# Patient Record
Sex: Female | Born: 1980 | Race: White | Hispanic: No | Marital: Married | Smoking: Never smoker
Health system: Southern US, Community
[De-identification: ages and names within clinical notes are randomized; demographics above are authoritative.]

## PROBLEM LIST (undated history)

## (undated) ENCOUNTER — Inpatient Hospital Stay (HOSPITAL_COMMUNITY): Payer: Self-pay

## (undated) DIAGNOSIS — N611 Abscess of the breast and nipple: Secondary | ICD-10-CM

## (undated) HISTORY — PX: BREAST BIOPSY: SHX20

---

## 2014-06-13 NOTE — L&D Delivery Note (Signed)
Delivery Note  Patient presented to the MAU having painful contractions and had cervical change to 4.5. She was admitted and had SROM at 1020. Patient was pushing and making slow progress. She developed a fever to 101 F around 1400, there was fetal tachycardia to the 170s with decelerations. The patient was started on ampicillin and gentamycin for possible chorioamnionitis vs. fever secondary to epidural. Dr. Jolayne Panther was contacted for possible VAD. Patient was given pitocin around 1510 and was to continue pushing for an hour before VAD would be considered. She continued to progress and was able to deliver by SVAD. She pushed for a total of about 3 hours. Fever resolved after delivery, and antibiotics were discontinued.   At 4:00 PM a viable and healthy female was delivered via Vaginal, Spontaneous Delivery (Presentation: Left Occiput Anterior).  APGAR: 7, 9; weight pending.   Placenta status: Intact, Spontaneous.  Cord: 3 vessels with the following complications: None.  Cord pH: N/A  Anesthesia: Epidural  Episiotomy: None Lacerations: 2nd degree Suture Repair: vicryl Est. Blood Loss (mL): 379  Mom to postpartum.  Baby to Couplet care / Skin to Skin.  Jamelle Haring, MD [facility], PGY-1 03/21/2015, 5:25 PM

## 2014-07-28 ENCOUNTER — Inpatient Hospital Stay (HOSPITAL_COMMUNITY): Payer: Medicaid Other

## 2014-07-28 ENCOUNTER — Inpatient Hospital Stay (HOSPITAL_COMMUNITY)
Admission: AD | Admit: 2014-07-28 | Discharge: 2014-07-28 | Disposition: A | Discharge status: Home / Self Care | Payer: Medicaid Other | Source: Ambulatory Visit | Attending: Obstetrics & Gynecology | Admitting: Obstetrics & Gynecology

## 2014-07-28 ENCOUNTER — Encounter (HOSPITAL_COMMUNITY): Payer: Self-pay | Admitting: *Deleted

## 2014-07-28 DIAGNOSIS — N8320 Unspecified ovarian cysts: Secondary | ICD-10-CM | POA: Diagnosis not present

## 2014-07-28 DIAGNOSIS — O9989 Other specified diseases and conditions complicating pregnancy, childbirth and the puerperium: Secondary | ICD-10-CM | POA: Diagnosis not present

## 2014-07-28 DIAGNOSIS — R109 Unspecified abdominal pain: Secondary | ICD-10-CM | POA: Diagnosis not present

## 2014-07-28 DIAGNOSIS — O26899 Other specified pregnancy related conditions, unspecified trimester: Secondary | ICD-10-CM

## 2014-07-28 DIAGNOSIS — O3481 Maternal care for other abnormalities of pelvic organs, first trimester: Secondary | ICD-10-CM | POA: Insufficient documentation

## 2014-07-28 DIAGNOSIS — Z3A01 Less than 8 weeks gestation of pregnancy: Secondary | ICD-10-CM | POA: Diagnosis not present

## 2014-07-28 DIAGNOSIS — N83209 Unspecified ovarian cyst, unspecified side: Secondary | ICD-10-CM

## 2014-07-28 LAB — URINALYSIS, ROUTINE W REFLEX MICROSCOPIC
Bilirubin Urine: NEGATIVE
GLUCOSE, UA: NEGATIVE mg/dL
Ketones, ur: NEGATIVE mg/dL
Nitrite: NEGATIVE
Protein, ur: NEGATIVE mg/dL
Specific Gravity, Urine: 1.02 (ref 1.005–1.030)
UROBILINOGEN UA: 0.2 mg/dL (ref 0.0–1.0)
pH: 5.5 (ref 5.0–8.0)

## 2014-07-28 LAB — HCG, QUANTITATIVE, PREGNANCY: hCG, Beta Chain, Quant, S: 40142 m[IU]/mL — ABNORMAL HIGH (ref <5)

## 2014-07-28 LAB — CBC
HEMATOCRIT: 38 % (ref 36.0–46.0)
Hemoglobin: 12.8 g/dL (ref 12.0–15.0)
MCH: 31.3 pg (ref 26.0–34.0)
MCHC: 33.7 g/dL (ref 30.0–36.0)
MCV: 92.9 fL (ref 78.0–100.0)
PLATELETS: 239 10*3/uL (ref 150–400)
RBC: 4.09 MIL/uL (ref 3.87–5.11)
RDW: 12.5 % (ref 11.5–15.5)
WBC: 8.1 10*3/uL (ref 4.0–10.5)

## 2014-07-28 LAB — ABO/RH: ABO/RH(D): O POS

## 2014-07-28 LAB — POCT PREGNANCY, URINE: PREG TEST UR: POSITIVE — AB

## 2014-07-28 LAB — WET PREP, GENITAL
CLUE CELLS WET PREP: NONE SEEN
TRICH WET PREP: NONE SEEN
YEAST WET PREP: NONE SEEN

## 2014-07-28 LAB — URINE MICROSCOPIC-ADD ON

## 2014-07-28 MED ORDER — ACETAMINOPHEN-CODEINE #3 300-30 MG PO TABS
1.0000 | ORAL_TABLET | Freq: Four times a day (QID) | ORAL | Status: DC | PRN
Start: 2014-07-28 — End: 2014-08-02

## 2014-07-28 NOTE — MAU Provider Note (Signed)
History     CSN: [id number]  Arrival date and time: 07/28/14 1537   First Provider Initiated Contact with Patient 07/28/14 1637      Chief Complaint  Patient presents with  . Abdominal Pain   HPI   Pam Kennedy is a 34 y.o. female G2P0010 who presents with abdominal pain that comes and goes. The pain has been off and on for a couple of days. She denies vaginal bleeding. She does have some yellow vaginal discharge at times.   She denies vaginal bleeding   OB History    Gravida Para Term Preterm AB TAB SAB Ectopic Multiple Living   2    1  1          Past Medical History  Diagnosis Date  . Medical history non-contributory     Past Surgical History  Procedure Laterality Date  . No past surgeries      History reviewed. No pertinent family history.  History  Substance Use Topics  . Smoking status: Never Smoker   . Smokeless tobacco: Never Used  . Alcohol Use: No    Allergies: No Known Allergies  Prescriptions prior to admission  Medication Sig Dispense Refill Last Dose  . vitamin E 400 UNIT capsule Take 400 Units by mouth daily.   07/28/2014 at Unknown time   Results for orders placed or performed during the hospital encounter of 07/28/14 (from the past 48 hour(s))  Urinalysis, Routine w reflex microscopic     Status: Abnormal   Collection Time: 07/28/14  4:00 PM  Result Value Ref Range   Color, Urine YELLOW YELLOW   APPearance HAZY (A) CLEAR   Specific Gravity, Urine 1.020 1.005 - 1.030   pH 5.5 5.0 - 8.0   Glucose, UA NEGATIVE NEGATIVE mg/dL   Hgb urine dipstick LARGE (A) NEGATIVE   Bilirubin Urine NEGATIVE NEGATIVE   Ketones, ur NEGATIVE NEGATIVE mg/dL   Protein, ur NEGATIVE NEGATIVE mg/dL   Urobilinogen, UA 0.2 0.0 - 1.0 mg/dL   Nitrite NEGATIVE NEGATIVE   Leukocytes, UA SMALL (A) NEGATIVE  Urine microscopic-add on     Status: Abnormal   Collection Time: 07/28/14  4:00 PM  Result Value Ref Range   Squamous Epithelial / LPF FEW (A) RARE   WBC, UA  3-6 <3 WBC/hpf   RBC / HPF 0-2 <3 RBC/hpf  Pregnancy, urine POC     Status: Abnormal   Collection Time: 07/28/14  4:09 PM  Result Value Ref Range   Preg Test, Ur POSITIVE (A) NEGATIVE    Comment:        THE SENSITIVITY OF THIS METHODOLOGY IS >24 mIU/mL   Wet prep, genital     Status: Abnormal   Collection Time: 07/28/14  4:58 PM  Result Value Ref Range   Yeast Wet Prep HPF POC NONE SEEN NONE SEEN   Trich, Wet Prep NONE SEEN NONE SEEN   Clue Cells Wet Prep HPF POC NONE SEEN NONE SEEN   WBC, Wet Prep HPF POC FEW (A) NONE SEEN    Comment: MANY BACTERIA SEEN  CBC     Status: None   Collection Time: 07/28/14  5:05 PM  Result Value Ref Range   WBC 8.1 4.0 - 10.5 K/uL   RBC 4.09 3.87 - 5.11 MIL/uL   Hemoglobin 12.8 12.0 - 15.0 g/dL   HCT 77.4 12.8 - 78.6 %   MCV 92.9 78.0 - 100.0 fL   MCH 31.3 26.0 - 34.0 pg   MCHC  33.7 30.0 - 36.0 g/dL   RDW 16.112.5 09.611.5 - 04.515.5 %   Platelets 239 150 - 400 K/uL  ABO/Rh     Status: None (Preliminary result)   Collection Time: 07/28/14  5:05 PM  Result Value Ref Range   ABO/RH(D) O POS   hCG, quantitative, pregnancy     Status: Abnormal   Collection Time: 07/28/14  5:05 PM  Result Value Ref Range   hCG, Beta Chain, Quant, S 40142 (H) <5 mIU/mL    Comment:          GEST. AGE      CONC.  (mIU/mL)   <=1 WEEK        5 - 50     2 WEEKS       50 - 500     3 WEEKS       100 - 10,000     4 WEEKS     1,000 - 30,000     5 WEEKS     3,500 - 115,000   6-8 WEEKS     12,000 - 270,000    12 WEEKS     15,000 - 220,000        FEMALE AND NON-PREGNANT FEMALE:     LESS THAN 5 mIU/mL    [place] Ob Comp Less 14 Wks  07/28/2014   CLINICAL DATA:  Two day history of intermittent right lower quadrant abdominal pain and pelvic pain. Quantitative beta HCG pending. Unknown LMP.  EXAM: OBSTETRIC <14 WK US AND TRANSVAGINAL OB US  TECHNIQUE: Both transabdominal and transvaginal ultrasound examinations were performed for complete evaluation of the gestation as well as the maternal  uterus, adnexal regions, and pelvic cul-de-sac. Transvaginal technique was performed to assess early pregnancy.  COMPARISON:  None.  FINDINGS: Intrauterine gestational sac: Single gestational sac, normal in appearance.  Yolk sac:  Visualized.  Embryo:  Visualized.  Cardiac Activity: Visualized.  Heart Rate: 128  bpm  MSD: 6.1 mm   6 w 3 d  US EDC: 03/20/2015.  Maternal uterus/adnexae: Normal decidual reaction. No subchorionic hemorrhage. Right ovary measures approximately 5.4 x 4.0 x 5.2 cm, containing a simple cyst measuring 3.8 x 3.5 x 3.7 cm. Left ovary measures 2.7 x 1.2 x 2.1 cm containing small follicular cysts. No adnexal masses or free pelvic fluid.  IMPRESSION: 1. Single live intrauterine embryo with estimated gestational age [redacted] weeks 3 days by crown-rump length. [facility] 2. Approximate 4 cm corpus luteum cyst in the right ovary. 3. No adnexal masses or free pelvic fluid.   Electronically Signed   By: Hulan Saashomas  Lawrence M.D.   On: 07/28/2014 18:18   [place] Ob Transvaginal  07/28/2014   CLINICAL DATA:  Two day history of intermittent right lower quadrant abdominal pain and pelvic pain. Quantitative beta HCG pending. Unknown LMP.  EXAM: OBSTETRIC <14 WK US AND TRANSVAGINAL OB US  TECHNIQUE: Both transabdominal and transvaginal ultrasound examinations were performed for complete evaluation of the gestation as well as the maternal uterus, adnexal regions, and pelvic cul-de-sac. Transvaginal technique was performed to assess early pregnancy.  COMPARISON:  None.  FINDINGS: Intrauterine gestational sac: Single gestational sac, normal in appearance.  Yolk sac:  Visualized.  Embryo:  Visualized.  Cardiac Activity: Visualized.  Heart Rate: 128  bpm  MSD: 6.1 mm   6 w 3 d  US EDC: 03/20/2015.  Maternal uterus/adnexae: Normal decidual reaction. No subchorionic hemorrhage. Right ovary measures approximately 5.4 x 4.0 x 5.2 cm, containing a simple  cyst measuring 3.8 x 3.5 x 3.7 cm. Left ovary measures 2.7 x  1.2 x 2.1 cm containing small follicular cysts. No adnexal masses or free pelvic fluid.  IMPRESSION: 1. Single live intrauterine embryo with estimated gestational age [redacted] weeks 3 days by crown-rump length. Ultrasound [facility] 03/20/2015. 2. Approximate 4 cm corpus luteum cyst in the right ovary. 3. No adnexal masses or free pelvic fluid.   Electronically Signed   By: Hulan Saas M.D.   On: 07/28/2014 18:18   [place] Art/ven Flow Abd Pelv Doppler Limited  07/28/2014   CLINICAL DATA:  Pregnant, intermittent RIGHT lower quadrant and RIGHT pelvic pain, intrauterine pregnancy by ultrasound, large RIGHT ovarian cyst question ovarian torsion  EXAM: DOPPLER ULTRASOUND OF OVARIES  TECHNIQUE: Color and duplex Doppler ultrasound was utilized to evaluate blood flow to the ovaries.  COMPARISON:  Earlier OB ultrasound of 07/28/2014  FINDINGS: Pulsed Doppler evaluation of both ovaries was performed.  The RIGHT ovary, which is enlarged by a 3.8 x 4.2 x 3.3 cm diameter simple appearing cyst, demonstrates internal blood flow on color Doppler imaging.  Arterial and venous waveforms are present within the RIGHT ovary.  No evidence of RIGHT ovarian torsion.  Doppler assessment of the LEFT ovary is limited by ovarian size and position.  Blood flow is seen within the LEFT ovary on color Doppler imaging.  However, only a faint venous waveform is obtained and an adequate arterial waveform is not identified, felt to be due to ovarian position and technical limitations.  Patient is nontender at the LEFT adnexa with transvaginal imaging.  IMPRESSION: RIGHT ovary enlarged by a 4.2 cm diameter simple appearing cyst.  No evidence of RIGHT ovarian torsion.  Limited Doppler assessment of the LEFT ovary, demonstrating internal blood flow on color Doppler imaging and a faint venous waveform though a definite arterial waveform was not obtained, felt be technical in origin due to ovarian position ; patient is nontender at the LEFT adnexa with transvaginal  imaging.   Electronically Signed   By: Ulyses Southward M.D.   On: 07/28/2014 19:46    Review of Systems  Constitutional: Negative for fever.  Gastrointestinal: Positive for abdominal pain (Right-mid abdominal pain ). Negative for nausea and vomiting.   Physical Exam   Blood pressure 98/68, pulse 79, temperature 98.7 F (37.1 C), temperature source Oral, resp. rate 18, height  (1.6 m), weight 58.151 kg (128 lb 3.2 oz).  Physical Exam  Constitutional: She is oriented to person, place, and time. She appears well-developed and well-nourished. No distress.  HENT:  Head: Normocephalic.  Eyes: Pupils are equal, round, and reactive to light.  Neck: Normal range of motion. Neck supple.  Respiratory: Effort normal.  GI: Soft. There is tenderness in the right lower quadrant, suprapubic area and left lower quadrant. There is no rigidity, no rebound and no guarding.  Genitourinary: Vaginal discharge found.  Speculum exam: Vagina - Small amount of creamy, brown discharge, no odor Cervix - No contact bleeding Bimanual exam: Cervix closed Uterus non tender, slightly enlarged  Adnexa non tender, no masses bilaterally, + suprapubic tenderness  GC/Chlam, wet prep done Chaperone present for exam.   Musculoskeletal: Normal range of motion.  Neurological: She is alert and oriented to person, place, and time.  Skin: She is not diaphoretic.  Psychiatric: Her behavior is normal.    MAU Course  Procedures  None  MDM O positive blood type [place] Initial US shows a 4.2 simple cyst; Right ovary; blood flow not  documented; patient sent back to [place] to assess for blood flow/ rule out ovarian torsion.   Assessment and Plan   A: SIUP @ [redacted]w[redacted]d with cardiac activity  Abdominal pain in pregnancy Right, simple cyst 4.2 cm, no sign of torsion   P:  Discharge home in stable condition Start prenatal care ASAP RX: Tylenol #3 Return to MAU as needed, if symptoms worsen  First trimester warning signs  discussed  Contact the health department to start prenatal care.     Iona Hansen Rasch, NP 07/30/2014 4:18 PM

## 2014-07-28 NOTE — MAU Note (Signed)
RLQ pain for the last 2 days, [facility]  Denies bleeding.

## 2014-07-28 NOTE — MAU Note (Signed)
Pt states here for rlq pain that is intermittent. Denies bleeding or abnormal vaginal discharge. Unsure of LMP, hx irregular menstrual cycles. Had -upt three weeks ago, +upt at home last pm.

## 2014-07-29 LAB — HIV ANTIBODY (ROUTINE TESTING W REFLEX): HIV Screen 4th Generation wRfx: NONREACTIVE

## 2014-07-30 LAB — GC/CHLAMYDIA PROBE AMP (~~LOC~~) NOT AT ARMC
CHLAMYDIA, DNA PROBE: NEGATIVE
Neisseria Gonorrhea: NEGATIVE

## 2014-08-02 ENCOUNTER — Inpatient Hospital Stay (HOSPITAL_COMMUNITY): Payer: Medicaid Other

## 2014-08-02 ENCOUNTER — Encounter (HOSPITAL_COMMUNITY): Payer: Self-pay | Admitting: *Deleted

## 2014-08-02 ENCOUNTER — Inpatient Hospital Stay (HOSPITAL_COMMUNITY)
Admission: AD | Admit: 2014-08-02 | Discharge: 2014-08-02 | Disposition: A | Discharge status: Home / Self Care | Payer: Medicaid Other | Source: Ambulatory Visit | Attending: Obstetrics and Gynecology | Admitting: Obstetrics and Gynecology

## 2014-08-02 DIAGNOSIS — O209 Hemorrhage in early pregnancy, unspecified: Secondary | ICD-10-CM | POA: Insufficient documentation

## 2014-08-02 DIAGNOSIS — Z3A01 Less than 8 weeks gestation of pregnancy: Secondary | ICD-10-CM | POA: Diagnosis not present

## 2014-08-02 LAB — URINALYSIS, ROUTINE W REFLEX MICROSCOPIC
Bilirubin Urine: NEGATIVE
Glucose, UA: NEGATIVE mg/dL
KETONES UR: NEGATIVE mg/dL
LEUKOCYTES UA: NEGATIVE
NITRITE: NEGATIVE
PH: 6 (ref 5.0–8.0)
PROTEIN: NEGATIVE mg/dL
Specific Gravity, Urine: 1.02 (ref 1.005–1.030)
Urobilinogen, UA: 0.2 mg/dL (ref 0.0–1.0)

## 2014-08-02 LAB — URINE MICROSCOPIC-ADD ON

## 2014-08-02 NOTE — MAU Provider Note (Signed)
History     CSN: [id number]  Arrival date and time: 08/02/14 1932   None     Chief Complaint  Patient presents with  . Abdominal Pain  . Vaginal Bleeding   HPI This is a 34 y.o. female at [redacted]w[redacted]d who presents with onset of bleeding today. Was seen several days ago and had an [place] for abdominal pain showing a viable IUP.  Worried about the bleeding. States has not had sex (husband states no intercourse).  States pain comes and goes.   RN Note: Pain in lower stomach that comes and goes. Pain is staying longer and having some bleeding tonight. Not a lot of bleeding but is bright          OB History    Gravida Para Term Preterm AB TAB SAB Ectopic Multiple Living   Past Medical History  Diagnosis Date  . Medical history non-contributory     Past Surgical History  Procedure Laterality Date  . No past surgeries      History reviewed. No pertinent family history.  History  Substance Use Topics  . Smoking status: Never Smoker   . Smokeless tobacco: Never Used  . Alcohol Use: No    Allergies: No Known Allergies  Prescriptions prior to admission  Medication Sig Dispense Refill Last Dose  . Prenatal Vit-Fe Fumarate-FA (PRENATAL MULTIVITAMIN) TABS tablet Take 1 tablet by mouth daily at 12 noon.   08/02/2014 at Unknown time  . vitamin E 400 UNIT capsule Take 400 Units by mouth daily.   08/02/2014 at Unknown time  . acetaminophen-codeine (TYLENOL #3) 300-30 MG per tablet Take 1-2 tablets by mouth every 6 (six) hours as needed for moderate pain. (Patient not taking: Reported on 08/02/2014) 15 tablet 0     Review of Systems  Constitutional: Negative for fever, chills and malaise/fatigue.  Gastrointestinal: Positive for nausea and abdominal pain. Negative for vomiting, diarrhea and constipation.  Genitourinary:       Small amount of bleeding   Neurological: Negative for dizziness.   Physical Exam   Blood pressure 99/58, pulse 82, temperature 98.9 F  (37.2 C), resp. rate 18, height  (1.6 m), weight 127 lb 12.8 oz (57.97 kg), last menstrual period 03/27/2014.  Physical Exam  Constitutional: She is oriented to person, place, and time. She appears well-developed and well-nourished. No distress.  HENT:  Head: Normocephalic.  Cardiovascular: Normal rate.   Respiratory: Effort normal.  GI: Soft. She exhibits no distension. There is no tenderness. There is no rebound and no guarding.  Genitourinary: Vagina normal. Vaginal discharge: small to moderate red blood with a few small clots, cervix closed, uterus small and nontender.  Musculoskeletal: Normal range of motion.  Neurological: She is alert and oriented to person, place, and time.  Skin: Skin is warm and dry.  Psychiatric: She has a normal mood and affect.    MAU Course  Procedures  MDM US Ob Comp Less 14 Wks  07/28/2014   CLINICAL DATA:  Two day history of intermittent right lower quadrant abdominal pain and pelvic pain. Quantitative beta HCG pending. Unknown LMP.  EXAM: OBSTETRIC <14 WK [place] AND TRANSVAGINAL OB US  TECHNIQUE: Both transabdominal and transvaginal ultrasound examinations were performed for complete evaluation of the gestation as well as the maternal uterus, adnexal regions, and pelvic cul-de-sac. Transvaginal technique was performed to assess early pregnancy.  COMPARISON:  None.  FINDINGS: Intrauterine gestational  sac: Single gestational sac, normal in appearance.  Yolk sac:  Visualized.  Embryo:  Visualized.  Cardiac Activity: Visualized.  Heart Rate: 128  bpm  MSD: 6.1 mm   6 w 3 d  [place] EDC: 03/20/2015.  Maternal uterus/adnexae: Normal decidual reaction. No subchorionic hemorrhage. Right ovary measures approximately 5.4 x 4.0 x 5.2 cm, containing a simple cyst measuring 3.8 x 3.5 x 3.7 cm. Left ovary measures 2.7 x 1.2 x 2.1 cm containing small follicular cysts. No adnexal masses or free pelvic fluid.  IMPRESSION: 1. Single live intrauterine embryo with estimated  gestational age 287 weeks 3 days by crown-rump length. Ultrasound [facility] 03/20/2015. 2. Approximate 4 cm corpus luteum cyst in the right ovary. 3. No adnexal masses or free pelvic fluid.   Electronically Signed   By: Hulan Saas M.D.   On: 07/28/2014 18:18   US Ob Transvaginal  08/02/2014   CLINICAL DATA:  First trimester pregnancy, bleeding since 5 a.m., pain  EXAM: OBSTETRIC <14 WK [place] AND TRANSVAGINAL OB US  TECHNIQUE: Both transabdominal and transvaginal ultrasound examinations were performed for complete evaluation of the gestation as well as the maternal uterus, adnexal regions, and pelvic cul-de-sac. Transvaginal technique was performed to assess early pregnancy.  COMPARISON:  None.  FINDINGS: Intrauterine gestational sac: Visualized/normal in shape.  Yolk sac:  Present  Embryo:  Present  Cardiac Activity: Present  Heart Rate: 173  bpm  CRL:  13  mm   7 w   3 d                  [place] EDC: 03/18/2015  Maternal uterus/adnexae: Minimal subchorionic hemorrhage. 4 cm cyst right ovary likely corpus luteum. Left ovary normal. No free fluid.  IMPRESSION: 4 cm simple cyst right ovary.  Single live intrauterine gestation.   Electronically Signed   By: Esperanza Heir M.D.   On: 08/02/2014 21:44   US Ob Transvaginal  07/28/2014   CLINICAL DATA:  Two day history of intermittent right lower quadrant abdominal pain and pelvic pain. Quantitative beta HCG pending. Unknown LMP.  EXAM: OBSTETRIC <14 WK [place] AND TRANSVAGINAL OB US  TECHNIQUE: Both transabdominal and transvaginal ultrasound examinations were performed for complete evaluation of the gestation as well as the maternal uterus, adnexal regions, and pelvic cul-de-sac. Transvaginal technique was performed to assess early pregnancy.  COMPARISON:  None.  FINDINGS: Intrauterine gestational sac: Single gestational sac, normal in appearance.  Yolk sac:  Visualized.  Embryo:  Visualized.  Cardiac Activity: Visualized.  Heart Rate: 128  bpm  MSD: 6.1 mm   6 w 3 d  [place] EDC:  03/20/2015.  Maternal uterus/adnexae: Normal decidual reaction. No subchorionic hemorrhage. Right ovary measures approximately 5.4 x 4.0 x 5.2 cm, containing a simple cyst measuring 3.8 x 3.5 x 3.7 cm. Left ovary measures 2.7 x 1.2 x 2.1 cm containing small follicular cysts. No adnexal masses or free pelvic fluid.  IMPRESSION: 1. Single live intrauterine embryo with estimated gestational age 287 weeks 3 days by crown-rump length. Ultrasound [facility] 03/20/2015. 2. Approximate 4 cm corpus luteum cyst in the right ovary. 3. No adnexal masses or free pelvic fluid.   Electronically Signed   By: Hulan Saas M.D.   On: 07/28/2014 18:18   [place] Art/ven Flow Abd Pelv Doppler Limited  07/28/2014   CLINICAL DATA:  Pregnant, intermittent RIGHT lower quadrant and RIGHT pelvic pain, intrauterine pregnancy by ultrasound, large RIGHT ovarian cyst question ovarian torsion  EXAM: DOPPLER ULTRASOUND OF OVARIES  TECHNIQUE: Color and duplex Doppler ultrasound was utilized to evaluate blood flow to the ovaries.  COMPARISON:  Earlier OB ultrasound of 07/28/2014  FINDINGS: Pulsed Doppler evaluation of both ovaries was performed.  The RIGHT ovary, which is enlarged by a 3.8 x 4.2 x 3.3 cm diameter simple appearing cyst, demonstrates internal blood flow on color Doppler imaging.  Arterial and venous waveforms are present within the RIGHT ovary.  No evidence of RIGHT ovarian torsion.  Doppler assessment of the LEFT ovary is limited by ovarian size and position.  Blood flow is seen within the LEFT ovary on color Doppler imaging.  However, only a faint venous waveform is obtained and an adequate arterial waveform is not identified, felt to be due to ovarian position and technical limitations.  Patient is nontender at the LEFT adnexa with transvaginal imaging.  IMPRESSION: RIGHT ovary enlarged by a 4.2 cm diameter simple appearing cyst.  No evidence of RIGHT ovarian torsion.  Limited Doppler assessment of the LEFT ovary, demonstrating internal  blood flow on color Doppler imaging and a faint venous waveform though a definite arterial waveform was not obtained, felt be technical in origin due to ovarian position ; patient is nontender at the LEFT adnexa with transvaginal imaging.   Electronically Signed   By: Ulyses SouthwardMark  Boles M.D.   On: 07/28/2014 19:46     Assessment and Plan  A:  SIUP at 4074w2d       First trimester bleeding  P;  Reassured       Pelvic rest       Advised to start prenatal care  [facility] LLC[facility]ILLIAMS,MARIE 08/02/2014, 8:08 PM

## 2014-08-02 NOTE — MAU Note (Signed)
Pain in lower stomach that comes and goes. Pain is staying longer and having some bleeding tonight. Not a lot of bleeding but is bright

## 2014-08-02 NOTE — Discharge Instructions (Signed)
Vaginal Bleeding During Pregnancy, First Trimester  A small amount of bleeding (spotting) from the vagina is relatively common in early pregnancy. It usually stops on its own. Various things may cause bleeding or spotting in early pregnancy. Some bleeding may be related to the pregnancy, and some may not. In most cases, the bleeding is normal and is not a problem. However, bleeding can also be a sign of something serious. Be sure to tell your health care provider about any vaginal bleeding right away.  Some possible causes of vaginal bleeding during the first trimester include:  · Infection or inflammation of the cervix.  · Growths (polyps) on the cervix.  · Miscarriage or threatened miscarriage.  · Pregnancy tissue has developed outside of the uterus and in a fallopian tube (tubal pregnancy).  · Tiny cysts have developed in the uterus instead of pregnancy tissue (molar pregnancy).  HOME CARE INSTRUCTIONS   Watch your condition for any changes. The following actions may help to lessen any discomfort you are feeling:  · Follow your health care provider's instructions for limiting your activity. If your health care provider orders bed rest, you may need to stay in bed and only get up to use the bathroom. However, your health care provider may allow you to continue light activity.  · If needed, make plans for someone to help with your regular activities and responsibilities while you are on bed rest.  · Keep track of the number of pads you use each day, how often you change pads, and how soaked (saturated) they are. Write this down.  · Do not use tampons. Do not douche.  · Do not have sexual intercourse or orgasms until approved by your health care provider.  · If you pass any tissue from your vagina, save the tissue so you can show it to your health care provider.  · Only take over-the-counter or prescription medicines as directed by your health care provider.  · Do not take aspirin because it can make you  bleed.  · Keep all follow-up appointments as directed by your health care provider.  SEEK MEDICAL CARE IF:  · You have any vaginal bleeding during any part of your pregnancy.  · You have cramps or labor pains.  · You have a fever, not controlled by medicine.  SEEK IMMEDIATE MEDICAL CARE IF:   · You have severe cramps in your back or belly (abdomen).  · You pass large clots or tissue from your vagina.  · Your bleeding increases.  · You feel light-headed or weak, or you have fainting episodes.  · You have chills.  · You are leaking fluid or have a gush of fluid from your vagina.  · You pass out while having a bowel movement.  MAKE SURE YOU:  · Understand these instructions.  · Will watch your condition.  · Will get help right away if you are not doing well or get worse.  Document Released: 03/09/2005 Document Revised: 06/04/2013 Document Reviewed: 02/04/2013  [employer]® Patient Information ©2015 [employer]. This information is not intended to replace advice given to you by your health care provider. Make sure you discuss any questions you have with your health care provider.

## 2014-08-15 ENCOUNTER — Inpatient Hospital Stay (HOSPITAL_COMMUNITY)
Admission: AD | Admit: 2014-08-15 | Discharge: 2014-08-16 | Disposition: A | Discharge status: Home / Self Care | Payer: Medicaid Other | Source: Ambulatory Visit | Attending: Obstetrics & Gynecology | Admitting: Obstetrics & Gynecology

## 2014-08-15 ENCOUNTER — Encounter (HOSPITAL_COMMUNITY): Payer: Self-pay

## 2014-08-15 DIAGNOSIS — O200 Threatened abortion: Secondary | ICD-10-CM | POA: Diagnosis not present

## 2014-08-15 DIAGNOSIS — O43891 Other placental disorders, first trimester: Secondary | ICD-10-CM | POA: Diagnosis not present

## 2014-08-15 DIAGNOSIS — O468X1 Other antepartum hemorrhage, first trimester: Secondary | ICD-10-CM | POA: Diagnosis not present

## 2014-08-15 DIAGNOSIS — O209 Hemorrhage in early pregnancy, unspecified: Secondary | ICD-10-CM

## 2014-08-15 DIAGNOSIS — Z3A09 9 weeks gestation of pregnancy: Secondary | ICD-10-CM | POA: Insufficient documentation

## 2014-08-15 NOTE — MAU Note (Signed)
Passed golf ball size clot at home at 10 pm with few other small clots.  Having bright red vag bleeding, has been ongoing spotting but worse today.  Has used 2 pads today.  Low abd cramps ongoing and tonight the same pain level.

## 2014-08-16 ENCOUNTER — Inpatient Hospital Stay (HOSPITAL_COMMUNITY): Payer: Medicaid Other

## 2014-08-16 LAB — URINALYSIS, ROUTINE W REFLEX MICROSCOPIC
Bilirubin Urine: NEGATIVE
GLUCOSE, UA: NEGATIVE mg/dL
Ketones, ur: NEGATIVE mg/dL
Leukocytes, UA: NEGATIVE
Nitrite: NEGATIVE
PROTEIN: NEGATIVE mg/dL
Specific Gravity, Urine: 1.01 (ref 1.005–1.030)
Urobilinogen, UA: 0.2 mg/dL (ref 0.0–1.0)
pH: 6.5 (ref 5.0–8.0)

## 2014-08-16 LAB — URINE MICROSCOPIC-ADD ON

## 2014-08-16 NOTE — MAU Provider Note (Signed)
History     CSN: [id number]  Arrival date and time: 08/15/14 2327   First Provider Initiated Contact with Patient 08/16/14 0037      Chief Complaint  Patient presents with  . Vaginal Bleeding   HPI   Pam Kennedy is a 34 y.o. female G2P0010 at 1737w1d who presents with worsening vaginal bleeding. She has been seen in MAU twice now with vaginal bleeding. She feels that at 3:00 pm she felt a lot of pain, she laid down and felt a little better. Today she started having heavier vaginal bleeding that is bright red. She feels she is losing her baby.   OB History    Gravida Para Term Preterm AB TAB SAB Ectopic Multiple Living   2    1  1          Past Medical History  Diagnosis Date  . Medical history non-contributory     Past Surgical History  Procedure Laterality Date  . No past surgeries      History reviewed. No pertinent family history.  History  Substance Use Topics  . Smoking status: Never Smoker   . Smokeless tobacco: Never Used  . Alcohol Use: No    Allergies: No Known Allergies  Prescriptions prior to admission  Medication Sig Dispense Refill Last Dose  . Prenatal Vit-Fe Fumarate-FA (PRENATAL MULTIVITAMIN) TABS tablet Take 1 tablet by mouth daily at 12 noon.   08/15/2014 at Unknown time  . vitamin E 400 UNIT capsule Take 400 Units by mouth daily.   08/15/2014 at Unknown time   Results for orders placed or performed during the hospital encounter of 08/15/14 (from the past 48 hour(s))  Urinalysis, Routine w reflex microscopic     Status: Abnormal   Collection Time: 08/15/14 11:40 PM  Result Value Ref Range   Color, Urine ORANGE (A) YELLOW    Comment: BIOCHEMICALS MAY BE AFFECTED BY COLOR   APPearance CLEAR CLEAR   Specific Gravity, Urine 1.010 1.005 - 1.030   pH 6.5 5.0 - 8.0   Glucose, UA NEGATIVE NEGATIVE mg/dL   Hgb urine dipstick LARGE (A) NEGATIVE   Bilirubin Urine NEGATIVE NEGATIVE   Ketones, ur NEGATIVE NEGATIVE mg/dL   Protein, ur NEGATIVE NEGATIVE  mg/dL   Urobilinogen, UA 0.2 0.0 - 1.0 mg/dL   Nitrite NEGATIVE NEGATIVE   Leukocytes, UA NEGATIVE NEGATIVE  Urine microscopic-add on     Status: Abnormal   Collection Time: 08/15/14 11:40 PM  Result Value Ref Range   Squamous Epithelial / LPF RARE RARE   WBC, UA 3-6 <3 WBC/hpf   RBC / HPF 21-50 <3 RBC/hpf   Bacteria, UA FEW (A) RARE   [place] Ob Transvaginal  08/16/2014   CLINICAL DATA:  Bleeding in first trimester pregnancy.  EXAM: OBSTETRIC <14 WK ULTRASOUND  TECHNIQUE: Transabdominal ultrasound was performed for evaluation of the gestation as well as the maternal uterus and adnexal regions.  COMPARISON:  08/02/2014  FINDINGS: Intrauterine gestational sac: Present, deformed along the left wall near a subchorionic hematoma.  Yolk sac:  Present  Embryo:  Present  Cardiac Activity: Present  Heart Rate: 172 bpm  CRL:   28.1  mm   9 w 5 d                  US EDC: 03/16/2015  Maternal uterus/adnexae: There is a moderate to large subchorionic hematoma along the left aspect of the sac, extending into the left cornual region. The hematoma measures 3.6 x  2.2 x 2.2 cm.  IMPRESSION: 1. Sizable subchorionic hematoma, 36 x 22 x 22 mm. 2. Normal fetal heart rate; normal growth since 08/02/2014.   Electronically Signed   By: Marnee Spring M.D.   On: 08/16/2014 04:26     Review of Systems  Constitutional: Negative for fever and chills.  Gastrointestinal: Positive for abdominal pain. Negative for nausea, vomiting, diarrhea and constipation.  Genitourinary:       + vaginal bleeding    Physical Exam   Blood pressure 93/58, pulse 70, temperature 98.4 F (36.9 C), temperature source Oral, resp. rate 16, height  (1.6 m), weight 57.72 kg (127 lb 4 oz), last menstrual period 03/27/2014, SpO2 99 %.  Physical Exam  Constitutional: She is oriented to person, place, and time. She appears well-developed and well-nourished. No distress.  HENT:  Head: Normocephalic.  Eyes: Pupils are equal, round, and reactive  to light.  Neck: Normal range of motion.  Cardiovascular: Normal rate and normal heart sounds.   Respiratory: Effort normal and breath sounds normal. No respiratory distress.  GI: Soft.  Genitourinary:  Small-Moderate amount of dark red blood noted on pad.   Musculoskeletal: Normal range of motion.  Neurological: She is alert and oriented to person, place, and time.  Skin: Skin is warm. She is not diaphoretic.  Psychiatric: Her behavior is normal.    MAU Course  Procedures  None   MDM A positive blood type [place] UA  Assessment and Plan   A:  Small-Moderate subchorionic hemorrhage Vaginal bleeding in pregnancy  Threatened miscarriage  P:  Discharge home in stable condition Pelvic rest Bleeding precautions Start prenatal care ASAP Return to MAU if symptoms worsen Limited activity.   Iona Hansen Marguerite Jarboe, NP 08/16/2014 12:40 AM

## 2014-08-16 NOTE — Discharge Instructions (Signed)
Pelvic Rest °Pelvic rest is sometimes recommended for women when:  °· The placenta is partially or completely covering the opening of the cervix (placenta previa). °· There is bleeding between the uterine wall and the amniotic sac in the first trimester (subchorionic hemorrhage). °· The cervix begins to open without labor starting (incompetent cervix, cervical insufficiency). °· The labor is too early (preterm labor). °HOME CARE INSTRUCTIONS °· Do not have sexual intercourse, stimulation, or an orgasm. °· Do not use tampons, douche, or put anything in the vagina. °· Do not lift anything over 10 pounds (4.5 kg). °· Avoid strenuous activity or straining your pelvic muscles. °SEEK MEDICAL CARE IF:  °· You have any vaginal bleeding during pregnancy. Treat this as a potential emergency. °· You have cramping pain felt low in the stomach (stronger than menstrual cramps). °· You notice vaginal discharge (watery, mucus, or bloody). °· You have a low, dull backache. °· There are regular contractions or uterine tightening. °SEEK IMMEDIATE MEDICAL CARE IF: °You have vaginal bleeding and have placenta previa.  °Document Released: 09/24/2010 Document Revised: 08/22/2011 Document Reviewed: 09/24/2010 °ExitCare® Patient Information ©2015 [employer]. This information is not intended to replace advice given to you by your health care provider. Make sure you discuss any questions you have with your health care provider. ° °Subchorionic Hematoma °A subchorionic hematoma is a gathering of blood between the outer wall of the placenta and the inner wall of the womb (uterus). The placenta is the organ that connects the fetus to the wall of the uterus. The placenta performs the feeding, breathing (oxygen to the fetus), and waste removal (excretory work) of the fetus.  °Subchorionic hematoma is the most common abnormality found on a result from ultrasonography done during the first trimester or early second trimester of pregnancy. If  there has been little or no vaginal bleeding, early small hematomas usually shrink on their own and do not affect your baby or pregnancy. The blood is gradually absorbed over 1-2 weeks. When bleeding starts later in pregnancy or the hematoma is larger or occurs in an older pregnant woman, the outcome may not be as good. Larger hematomas may get bigger, which increases the chances for miscarriage. Subchorionic hematoma also increases the risk of premature detachment of the placenta from the uterus, preterm (premature) labor, and stillbirth. °HOME CARE INSTRUCTIONS °· Stay on bed rest if your health care provider recommends this. Although bed rest will not prevent more bleeding or prevent a miscarriage, your health care provider may recommend bed rest until you are advised otherwise. °· Avoid heavy lifting (more than 10 lb [4.5 kg]), exercise, sexual intercourse, or douching as directed by your health care provider. °· Keep track of the number of pads you use each day and how soaked (saturated) they are. Write down this information. °· Do not use tampons. °· Keep all follow-up appointments as directed by your health care provider. Your health care provider may ask you to have follow-up blood tests or ultrasound tests or both. °SEEK IMMEDIATE MEDICAL CARE IF: °· You have severe cramps in your stomach, back, abdomen, or pelvis. °· You have a fever. °· You pass large clots or tissue. Save any tissue for your health care provider to look at. °· Your bleeding increases or you become lightheaded, feel weak, or have fainting episodes. °Document Released: 09/14/2006 Document Revised: 10/14/2013 Document Reviewed: 12/27/2012 °ExitCare® Patient Information ©2015 [employer]. This information is not intended to replace advice given to you by your health care provider.   Make sure you discuss any questions you have with your health care provider. ° °

## 2014-09-16 ENCOUNTER — Other Ambulatory Visit (HOSPITAL_COMMUNITY)
Admission: RE | Admit: 2014-09-16 | Discharge: 2014-09-16 | Disposition: A | Discharge status: Home / Self Care | Payer: Medicaid Other | Source: Ambulatory Visit | Attending: Obstetrics and Gynecology | Admitting: Obstetrics and Gynecology

## 2014-09-16 ENCOUNTER — Ambulatory Visit (INDEPENDENT_AMBULATORY_CARE_PROVIDER_SITE_OTHER): Payer: Medicaid Other | Admitting: Obstetrics and Gynecology

## 2014-09-16 ENCOUNTER — Encounter: Payer: Self-pay | Admitting: Obstetrics and Gynecology

## 2014-09-16 VITALS — BP 105/64 | HR 89 | Temp 98.0°F | Wt 127.2 lb

## 2014-09-16 DIAGNOSIS — Z113 Encounter for screening for infections with a predominantly sexual mode of transmission: Secondary | ICD-10-CM

## 2014-09-16 DIAGNOSIS — O99612 Diseases of the digestive system complicating pregnancy, second trimester: Secondary | ICD-10-CM | POA: Diagnosis not present

## 2014-09-16 DIAGNOSIS — K5900 Constipation, unspecified: Secondary | ICD-10-CM | POA: Insufficient documentation

## 2014-09-16 DIAGNOSIS — Z1151 Encounter for screening for human papillomavirus (HPV): Secondary | ICD-10-CM | POA: Insufficient documentation

## 2014-09-16 DIAGNOSIS — Z118 Encounter for screening for other infectious and parasitic diseases: Secondary | ICD-10-CM

## 2014-09-16 DIAGNOSIS — Z3402 Encounter for supervision of normal first pregnancy, second trimester: Secondary | ICD-10-CM | POA: Diagnosis present

## 2014-09-16 DIAGNOSIS — Z01419 Encounter for gynecological examination (general) (routine) without abnormal findings: Secondary | ICD-10-CM | POA: Insufficient documentation

## 2014-09-16 DIAGNOSIS — Z124 Encounter for screening for malignant neoplasm of cervix: Secondary | ICD-10-CM

## 2014-09-16 LAB — POCT URINALYSIS DIP (DEVICE)
BILIRUBIN URINE: NEGATIVE
Glucose, UA: NEGATIVE mg/dL
Hgb urine dipstick: NEGATIVE
Ketones, ur: NEGATIVE mg/dL
LEUKOCYTES UA: NEGATIVE
NITRITE: NEGATIVE
Protein, ur: 30 mg/dL — AB
Specific Gravity, Urine: 1.02 (ref 1.005–1.030)
UROBILINOGEN UA: 0.2 mg/dL (ref 0.0–1.0)
pH: 7 (ref 5.0–8.0)

## 2014-09-16 MED ORDER — DOCUSATE SODIUM 100 MG PO CAPS: 100.0000 mg | ORAL_CAPSULE | Freq: Every day | ORAL | Status: DC | PRN

## 2014-09-16 NOTE — Patient Instructions (Signed)
Second Trimester of Pregnancy The second trimester is from week 13 through week 28, months 4 through 6. The second trimester is often a time when you feel your best. Your body has also adjusted to being pregnant, and you begin to feel better physically. Usually, morning sickness has lessened or quit completely, you may have more energy, and you may have an increase in appetite. The second trimester is also a time when the fetus is growing rapidly. At the end of the sixth month, the fetus is about 9 inches long and weighs about 1 pounds. You will likely begin to feel the baby move (quickening) between 18 and 20 weeks of the pregnancy. BODY CHANGES Your body goes through many changes during pregnancy. The changes vary from woman to woman.   Your weight will continue to increase. You will notice your lower abdomen bulging out.  You may begin to get stretch marks on your hips, abdomen, and breasts.  You may develop headaches that can be relieved by medicines approved by your health care provider.  You may urinate more often because the fetus is pressing on your bladder.  You may develop or continue to have heartburn as a result of your pregnancy.  You may develop constipation because certain hormones are causing the muscles that push waste through your intestines to slow down.  You may develop hemorrhoids or swollen, bulging veins (varicose veins).  You may have back pain because of the weight gain and pregnancy hormones relaxing your joints between the bones in your pelvis and as a result of a shift in weight and the muscles that support your balance.  Your breasts will continue to grow and be tender.  Your gums may bleed and may be sensitive to brushing and flossing.  Dark spots or blotches (chloasma, mask of pregnancy) may develop on your face. This will likely fade after the baby is born.  A dark line from your belly button to the pubic area (linea nigra) may appear. This will likely fade  after the baby is born.  You may have changes in your hair. These can include thickening of your hair, rapid growth, and changes in texture. Some women also have hair loss during or after pregnancy, or hair that feels dry or thin. Your hair will most likely return to normal after your baby is born. WHAT TO EXPECT AT YOUR PRENATAL VISITS During a routine prenatal visit:  You will be weighed to make sure you and the fetus are growing normally.  Your blood pressure will be taken.  Your abdomen will be measured to track your baby's growth.  The fetal heartbeat will be listened to.  Any test results from the previous visit will be discussed. Your health care provider may ask you:  How you are feeling.  If you are feeling the baby move.  If you have had any abnormal symptoms, such as leaking fluid, bleeding, severe headaches, or abdominal cramping.  If you have any questions. Other tests that may be performed during your second trimester include:  Blood tests that check for:  Low iron levels (anemia).  Gestational diabetes (between 24 and 28 weeks).  Rh antibodies.  Urine tests to check for infections, diabetes, or protein in the urine.  An ultrasound to confirm the proper growth and development of the baby.  An amniocentesis to check for possible genetic problems.  Fetal screens for spina bifida and Down syndrome. HOME CARE INSTRUCTIONS   Avoid all smoking, herbs, alcohol, and unprescribed   drugs. These chemicals affect the formation and growth of the baby.  Follow your health care provider's instructions regarding medicine use. There are medicines that are either safe or unsafe to take during pregnancy.  Exercise only as directed by your health care provider. Experiencing uterine cramps is a good sign to stop exercising.  Continue to eat regular, healthy meals.  Wear a good support bra for breast tenderness.  Do not use hot tubs, steam rooms, or saunas.  Wear your  seat belt at all times when driving.  Avoid raw meat, uncooked cheese, cat litter boxes, and soil used by cats. These carry germs that can cause birth defects in the baby.  Take your prenatal vitamins.  Try taking a stool softener (if your health care provider approves) if you develop constipation. Eat more high-fiber foods, such as fresh vegetables or fruit and whole grains. Drink plenty of fluids to keep your urine clear or pale yellow.  Take warm sitz baths to soothe any pain or discomfort caused by hemorrhoids. Use hemorrhoid cream if your health care provider approves.  If you develop varicose veins, wear support hose. Elevate your feet for 15 minutes, 3-4 times a day. Limit salt in your diet.  Avoid heavy lifting, wear low heel shoes, and practice good posture.  Rest with your legs elevated if you have leg cramps or low back pain.  Visit your dentist if you have not gone yet during your pregnancy. Use a soft toothbrush to brush your teeth and be gentle when you floss.  A sexual relationship may be continued unless your health care provider directs you otherwise.  Continue to go to all your prenatal visits as directed by your health care provider. SEEK MEDICAL CARE IF:   You have dizziness.  You have mild pelvic cramps, pelvic pressure, or nagging pain in the abdominal area.  You have persistent nausea, vomiting, or diarrhea.  You have a bad smelling vaginal discharge.  You have pain with urination. SEEK IMMEDIATE MEDICAL CARE IF:   You have a fever.  You are leaking fluid from your vagina.  You have spotting or bleeding from your vagina.  You have severe abdominal cramping or pain.  You have rapid weight gain or loss.  You have shortness of breath with chest pain.  You notice sudden or extreme swelling of your face, hands, ankles, feet, or legs.  You have not felt your baby move in over an hour.  You have severe headaches that do not go away with  medicine.  You have vision changes. Document Released: 05/24/2001 Document Revised: 06/04/2013 Document Reviewed: 07/31/2012 [facility] Patient Information 2015 [place], [place]. This information is not intended to replace advice given to you by your health care provider. Make sure you discuss any questions you have with your health care provider. Constipation Constipation is when a person has fewer than three bowel movements a week, has difficulty having a bowel movement, or has stools that are dry, hard, or larger than normal. As people grow older, constipation is more common. If you try to fix constipation with medicines that make you have a bowel movement (laxatives), the problem may get worse. Long-term laxative use may cause the muscles of the colon to become weak. A low-fiber diet, not taking in enough fluids, and taking certain medicines may make constipation worse.  CAUSES   Certain medicines, such as antidepressants, pain medicine, iron supplements, antacids, and water pills.   Certain diseases, such as diabetes, irritable bowel syndrome (IBS), thyroid  disease, or depression.   Not drinking enough water.   Not eating enough fiber-rich foods.   Stress or travel.   Lack of physical activity or exercise.   Ignoring the urge to have a bowel movement.   Using laxatives too much.  SIGNS AND SYMPTOMS   Having fewer than three bowel movements a week.   Straining to have a bowel movement.   Having stools that are hard, dry, or larger than normal.   Feeling full or bloated.   Pain in the lower abdomen.   Not feeling relief after having a bowel movement.  DIAGNOSIS  Your health care provider will take a medical history and perform a physical exam. Further testing may be done for severe constipation. Some tests may include:  A barium enema X-ray to examine your rectum, colon, and, sometimes, your small intestine.   A sigmoidoscopy to examine your lower colon.    A colonoscopy to examine your entire colon. TREATMENT  Treatment will depend on the severity of your constipation and what is causing it. Some dietary treatments include drinking more fluids and eating more fiber-rich foods. Lifestyle treatments may include regular exercise. If these diet and lifestyle recommendations do not help, your health care provider may recommend taking over-the-counter laxative medicines to help you have bowel movements. Prescription medicines may be prescribed if over-the-counter medicines do not work.  HOME CARE INSTRUCTIONS   Eat foods that have a lot of fiber, such as fruits, vegetables, whole grains, and beans.  Limit foods high in fat and processed sugars, such as french fries, hamburgers, cookies, candies, and soda.   A fiber supplement may be added to your diet if you cannot get enough fiber from foods.   Drink enough fluids to keep your urine clear or pale yellow.   Exercise regularly or as directed by your health care provider.   Go to the restroom when you have the urge to go. Do not hold it.   Only take over-the-counter or prescription medicines as directed by your health care provider. Do not take other medicines for constipation without talking to your health care provider first.  SEEK IMMEDIATE MEDICAL CARE IF:   You have bright red blood in your stool.   Your constipation lasts for more than 4 days or gets worse.   You have abdominal or rectal pain.   You have thin, pencil-like stools.   You have unexplained weight loss. MAKE SURE YOU:   Understand these instructions.  Will watch your condition.  Will get help right away if you are not doing well or get worse. Document Released: 02/26/2004 Document Revised: 06/04/2013 Document Reviewed: 03/11/2013 ExitCare Patient Information 2015 [employer]. This information is not intended to replace advice given to you by your health care provider. Make sure you discuss any questions  you have with your health care provider.  

## 2014-09-16 NOTE — Progress Notes (Signed)
   Subjective:    Pam Kennedy is a 34 yo G2P0010 7071w4d being seen today for her first obstetrical visit.  Her obstetrical history is significant for advance paternal age [age]). Patient does intend to breast feed. Pregnancy history fully reviewed. [facility]  Patient reports constipation.  Filed Vitals:   09/16/14 0953  BP: 105/64  Pulse: 89  Temp: 98 F (36.7 C)  Weight: 127 lb 3.2 oz (57.698 kg)    HISTORY: OB History  Gravida Para Term Preterm AB SAB TAB Ectopic Multiple Living  2    1 1         # Outcome Date GA Lbr Len/2nd Weight Sex Delivery Anes PTL Lv  2 Current           1 SAB 2008             Past Medical History  Diagnosis Date  . Medical history non-contributory    Past Surgical History  Procedure Laterality Date  . No past surgeries     History reviewed. No pertinent family history.   Exam    Uterus:   S=D FHR 160  Pelvic Exam:    Perineum: No Hemorrhoids, Normal Perineum   Vulva: normal, Bartholin's, Urethra, Skene's normal   Vagina:  normal mucosa, normal discharge       Cervix: no bleeding following Pap, no lesions and nulliparous appearance   Adnexa: not evaluated   Bony Pelvis: average  System: Breast:  normal appearance, no masses or tenderness   Skin: normal coloration and turgor, no rashes    Neurologic: oriented, normal, grossly non-focal   Extremities: normal strength, tone, and muscle mass   HEENT PERRLA, extra ocular movement intact and thyroid without masses   Mouth/Teeth mucous membranes moist, pharynx normal without lesions and dental hygiene good   Neck supple and no masses   Cardiovascular: regular rate and rhythm, no murmurs or gallops   Respiratory:  appears well, vitals normal, no respiratory distress, acyanotic, normal RR, ear and throat exam is normal, neck free of mass or lymphadenopathy, chest clear, no wheezing, crepitations, rhonchi, normal symmetric air entry   Abdomen: soft,  non-tender; bowel sounds normal; no masses,  no organomegaly   Urinary: urethral meatus normal      Assessment:    Pregnancy: G2P0010 Patient Active Problem List   Diagnosis Date Noted  . Supervision of normal first pregnancy in second trimester 09/16/2014  . Constipation during pregnancy in second trimester 09/16/2014        Plan:     Initial labs drawn. Prenatal vitamins. Problem list reviewed and updated. Genetic Screening discussed Quad Screen: requested. Get next visit. 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  Follow up in 4 weeks. 50% of 30 min visit spent on counseling and coordination of care.  Rx [facility]  Ceana Fiala 09/16/2014

## 2014-09-16 NOTE — Progress Notes (Signed)
Interpreter Janette Jou Weight gain 25-35lbs New ob packet given  Will consult with husband concerning flu vaccine Pain on right side; US on 08/16/14 showed subchroinic hematoma

## 2014-09-17 LAB — PRENATAL PROFILE (SOLSTAS)
Antibody Screen: NEGATIVE
BASOS ABS: 0 10*3/uL (ref 0.0–0.1)
BASOS PCT: 0 % (ref 0–1)
Eosinophils Absolute: 0.1 10*3/uL (ref 0.0–0.7)
Eosinophils Relative: 1 % (ref 0–5)
HCT: 35.9 % — ABNORMAL LOW (ref 36.0–46.0)
HEP B S AG: NEGATIVE
HIV 1&2 Ab, 4th Generation: NONREACTIVE
Hemoglobin: 12.2 g/dL (ref 12.0–15.0)
Lymphocytes Relative: 16 % (ref 12–46)
Lymphs Abs: 1.2 10*3/uL (ref 0.7–4.0)
MCH: 30.8 pg (ref 26.0–34.0)
MCHC: 34 g/dL (ref 30.0–36.0)
MCV: 90.7 fL (ref 78.0–100.0)
MPV: 9.8 fL (ref 8.6–12.4)
Monocytes Absolute: 0.7 10*3/uL (ref 0.1–1.0)
Monocytes Relative: 9 % (ref 3–12)
NEUTROS ABS: 5.6 10*3/uL (ref 1.7–7.7)
NEUTROS PCT: 74 % (ref 43–77)
Platelets: 274 10*3/uL (ref 150–400)
RBC: 3.96 MIL/uL (ref 3.87–5.11)
RDW: 13.2 % (ref 11.5–15.5)
Rh Type: POSITIVE
Rubella: 11.5 Index — ABNORMAL HIGH (ref <0.90)
WBC: 7.6 10*3/uL (ref 4.0–10.5)

## 2014-09-17 LAB — CYTOLOGY - PAP

## 2014-09-17 LAB — CULTURE, OB URINE
Colony Count: NO GROWTH
Organism ID, Bacteria: NO GROWTH

## 2014-09-18 LAB — PRESCRIPTION MONITORING PROFILE (19 PANEL)
Amphetamine/Meth: NEGATIVE ng/mL
BUPRENORPHINE, URINE: NEGATIVE ng/mL
Barbiturate Screen, Urine: NEGATIVE ng/mL
Benzodiazepine Screen, Urine: NEGATIVE ng/mL
COCAINE METABOLITES: NEGATIVE ng/mL
Cannabinoid Scrn, Ur: NEGATIVE ng/mL
Carisoprodol, Urine: NEGATIVE ng/mL
Creatinine, Urine: 162.34 mg/dL (ref >20.0)
ECSTASY: NEGATIVE ng/mL
FENTANYL URINE: NEGATIVE ng/mL
Meperidine, Ur: NEGATIVE ng/mL
Methadone Screen, Urine: NEGATIVE ng/mL
Methaqualone: NEGATIVE ng/mL
Nitrites, Initial: NEGATIVE ug/mL
Opiate Screen, Urine: NEGATIVE ng/mL
Oxycodone Screen, Ur: NEGATIVE ng/mL
PH URINE, INITIAL: 7.5 pH (ref 4.5–8.9)
PROPOXYPHENE: NEGATIVE ng/mL
Phencyclidine, Ur: NEGATIVE ng/mL
TRAMADOL UR: NEGATIVE ng/mL
Tapentadol, urine: NEGATIVE ng/mL
Zolpidem, Urine: NEGATIVE ng/mL

## 2014-10-14 ENCOUNTER — Ambulatory Visit (INDEPENDENT_AMBULATORY_CARE_PROVIDER_SITE_OTHER): Payer: Medicaid Other | Admitting: Obstetrics and Gynecology

## 2014-10-14 VITALS — BP 99/61 | HR 91 | Temp 98.2°F | Wt 131.3 lb

## 2014-10-14 DIAGNOSIS — Z3402 Encounter for supervision of normal first pregnancy, second trimester: Secondary | ICD-10-CM

## 2014-10-14 LAB — POCT URINALYSIS DIP (DEVICE)
Bilirubin Urine: NEGATIVE
GLUCOSE, UA: NEGATIVE mg/dL
Hgb urine dipstick: NEGATIVE
KETONES UR: NEGATIVE mg/dL
LEUKOCYTES UA: NEGATIVE
Nitrite: NEGATIVE
Protein, ur: NEGATIVE mg/dL
Specific Gravity, Urine: 1.01 (ref 1.005–1.030)
UROBILINOGEN UA: 0.2 mg/dL (ref 0.0–1.0)
pH: 5.5 (ref 5.0–8.0)

## 2014-10-14 NOTE — Patient Instructions (Signed)
Second Trimester of Pregnancy The second trimester is from week 13 through week 28, months 4 through 6. The second trimester is often a time when you feel your best. Your body has also adjusted to being pregnant, and you begin to feel better physically. Usually, morning sickness has lessened or quit completely, you may have more energy, and you may have an increase in appetite. The second trimester is also a time when the fetus is growing rapidly. At the end of the sixth month, the fetus is about 9 inches long and weighs about 1 pounds. You will likely begin to feel the baby move (quickening) between 18 and 20 weeks of the pregnancy. BODY CHANGES Your body goes through many changes during pregnancy. The changes vary from woman to woman.   Your weight will continue to increase. You will notice your lower abdomen bulging out.  You may begin to get stretch marks on your hips, abdomen, and breasts.  You may develop headaches that can be relieved by medicines approved by your health care provider.  You may urinate more often because the fetus is pressing on your bladder.  You may develop or continue to have heartburn as a result of your pregnancy.  You may develop constipation because certain hormones are causing the muscles that push waste through your intestines to slow down.  You may develop hemorrhoids or swollen, bulging veins (varicose veins).  You may have back pain because of the weight gain and pregnancy hormones relaxing your joints between the bones in your pelvis and as a result of a shift in weight and the muscles that support your balance.  Your breasts will continue to grow and be tender.  Your gums may bleed and may be sensitive to brushing and flossing.  Dark spots or blotches (chloasma, mask of pregnancy) may develop on your face. This will likely fade after the baby is born.  A dark line from your belly button to the pubic area (linea nigra) may appear. This will likely fade  after the baby is born.  You may have changes in your hair. These can include thickening of your hair, rapid growth, and changes in texture. Some women also have hair loss during or after pregnancy, or hair that feels dry or thin. Your hair will most likely return to normal after your baby is born. WHAT TO EXPECT AT YOUR PRENATAL VISITS During a routine prenatal visit:  You will be weighed to make sure you and the fetus are growing normally.  Your blood pressure will be taken.  Your abdomen will be measured to track your baby's growth.  The fetal heartbeat will be listened to.  Any test results from the previous visit will be discussed. Your health care provider may ask you:  How you are feeling.  If you are feeling the baby move.  If you have had any abnormal symptoms, such as leaking fluid, bleeding, severe headaches, or abdominal cramping.  If you have any questions. Other tests that may be performed during your second trimester include:  Blood tests that check for:  Low iron levels (anemia).  Gestational diabetes (between 24 and 28 weeks).  Rh antibodies.  Urine tests to check for infections, diabetes, or protein in the urine.  An ultrasound to confirm the proper growth and development of the baby.  An amniocentesis to check for possible genetic problems.  Fetal screens for spina bifida and Down syndrome. HOME CARE INSTRUCTIONS   Avoid all smoking, herbs, alcohol, and unprescribed   drugs. These chemicals affect the formation and growth of the baby.  Follow your health care provider's instructions regarding medicine use. There are medicines that are either safe or unsafe to take during pregnancy.  Exercise only as directed by your health care provider. Experiencing uterine cramps is a good sign to stop exercising.  Continue to eat regular, healthy meals.  Wear a good support bra for breast tenderness.  Do not use hot tubs, steam rooms, or saunas.  Wear your  seat belt at all times when driving.  Avoid raw meat, uncooked cheese, cat litter boxes, and soil used by cats. These carry germs that can cause birth defects in the baby.  Take your prenatal vitamins.  Try taking a stool softener (if your health care provider approves) if you develop constipation. Eat more high-fiber foods, such as fresh vegetables or fruit and whole grains. Drink plenty of fluids to keep your urine clear or pale yellow.  Take warm sitz baths to soothe any pain or discomfort caused by hemorrhoids. Use hemorrhoid cream if your health care provider approves.  If you develop varicose veins, wear support hose. Elevate your feet for 15 minutes, 3-4 times a day. Limit salt in your diet.  Avoid heavy lifting, wear low heel shoes, and practice good posture.  Rest with your legs elevated if you have leg cramps or low back pain.  Visit your dentist if you have not gone yet during your pregnancy. Use a soft toothbrush to brush your teeth and be gentle when you floss.  A sexual relationship may be continued unless your health care provider directs you otherwise.  Continue to go to all your prenatal visits as directed by your health care provider. SEEK MEDICAL CARE IF:   You have dizziness.  You have mild pelvic cramps, pelvic pressure, or nagging pain in the abdominal area.  You have persistent nausea, vomiting, or diarrhea.  You have a bad smelling vaginal discharge.  You have pain with urination. SEEK IMMEDIATE MEDICAL CARE IF:   You have a fever.  You are leaking fluid from your vagina.  You have spotting or bleeding from your vagina.  You have severe abdominal cramping or pain.  You have rapid weight gain or loss.  You have shortness of breath with chest pain.  You notice sudden or extreme swelling of your face, hands, ankles, feet, or legs.  You have not felt your baby move in over an hour.  You have severe headaches that do not go away with  medicine.  You have vision changes. Document Released: 05/24/2001 Document Revised: 06/04/2013 Document Reviewed: 07/31/2012 [employer] Patient Information 2015 [employer]. This information is not intended to replace advice given to you by your health care provider. Make sure you discuss any questions you have with your health care provider.  

## 2014-10-14 NOTE — Progress Notes (Signed)
Anatomy US scheduled for May 25th @ 0930

## 2014-10-14 NOTE — Progress Notes (Signed)
Interpreter here.  Has intermittent mid left buttock sharp pain, does not radiate; at times worse with sitting and better with walking. Advised local ice or heat.  Quad screen today. US scheduled. Reviewed normal PN lab results with pt.

## 2014-10-16 LAB — AFP, QUAD SCREEN
AFP: 64.9 ng/mL
Age Alone: 1:443 {titer}
Curr Gest Age: 17.4 wks.days
Down Syndrome Scr Risk Est: <1:38500 {titer}
HCG, Total: 57.38 IU/mL
INH: 226.4 pg/mL
Interpretation-AFP: NEGATIVE
MoM for AFP: 1.45
MoM for INH: 1.2
MoM for hCG: 1.83
Open Spina bifida: NEGATIVE
Osb Risk: 1:3200 {titer}
Tri 18 Scr Risk Est: NEGATIVE
Trisomy 18 (Edward) Syndrome Interp.: 1:66200 {titer}
uE3 Mom: 1.37
uE3 Value: 1.58 ng/mL

## 2014-11-05 ENCOUNTER — Ambulatory Visit (HOSPITAL_COMMUNITY)
Admission: RE | Admit: 2014-11-05 | Discharge: 2014-11-05 | Disposition: A | Discharge status: Home / Self Care | Payer: Medicaid Other | Source: Ambulatory Visit | Attending: Obstetrics and Gynecology | Admitting: Obstetrics and Gynecology

## 2014-11-05 ENCOUNTER — Other Ambulatory Visit: Payer: Self-pay | Admitting: Obstetrics and Gynecology

## 2014-11-05 DIAGNOSIS — IMO0002 Reserved for concepts with insufficient information to code with codable children: Secondary | ICD-10-CM

## 2014-11-05 DIAGNOSIS — Z3402 Encounter for supervision of normal first pregnancy, second trimester: Secondary | ICD-10-CM | POA: Insufficient documentation

## 2014-11-05 DIAGNOSIS — O35EXX0 Maternal care for other (suspected) fetal abnormality and damage, fetal genitourinary anomalies, not applicable or unspecified: Secondary | ICD-10-CM | POA: Insufficient documentation

## 2014-11-05 DIAGNOSIS — IMO0001 Reserved for inherently not codable concepts without codable children: Secondary | ICD-10-CM

## 2014-11-05 DIAGNOSIS — Z3A20 20 weeks gestation of pregnancy: Secondary | ICD-10-CM | POA: Insufficient documentation

## 2014-11-05 DIAGNOSIS — Z3689 Encounter for other specified antenatal screening: Secondary | ICD-10-CM | POA: Insufficient documentation

## 2014-11-05 DIAGNOSIS — O358XX0 Maternal care for other (suspected) fetal abnormality and damage, not applicable or unspecified: Secondary | ICD-10-CM | POA: Insufficient documentation

## 2014-11-05 DIAGNOSIS — O358XX1 Maternal care for other (suspected) fetal abnormality and damage, fetus 1: Secondary | ICD-10-CM

## 2014-11-12 ENCOUNTER — Ambulatory Visit (INDEPENDENT_AMBULATORY_CARE_PROVIDER_SITE_OTHER): Payer: Medicaid Other | Admitting: Family

## 2014-11-12 VITALS — BP 108/68 | HR 93 | Temp 98.7°F | Wt 137.2 lb

## 2014-11-12 DIAGNOSIS — Z3402 Encounter for supervision of normal first pregnancy, second trimester: Secondary | ICD-10-CM

## 2014-11-12 LAB — POCT URINALYSIS DIP (DEVICE)
Bilirubin Urine: NEGATIVE
GLUCOSE, UA: NEGATIVE mg/dL
Hgb urine dipstick: NEGATIVE
Ketones, ur: NEGATIVE mg/dL
NITRITE: NEGATIVE
PH: 7 (ref 5.0–8.0)
Protein, ur: NEGATIVE mg/dL
Specific Gravity, Urine: 1.015 (ref 1.005–1.030)
UROBILINOGEN UA: 0.2 mg/dL (ref 0.0–1.0)

## 2014-11-12 MED ORDER — CONCEPT DHA 53.5-38-1 MG PO CAPS: 1.0000 | ORAL_CAPSULE | Freq: Every day | ORAL | Status: DC

## 2014-11-12 NOTE — Progress Notes (Signed)
Reviewed ultrasound results > r sided pyelectasis, rescan at 28 wks.  Mother really worried, provided reassurance.  Changed vitamins to [employer] DHA. Urine results not available at discharge.

## 2014-11-17 ENCOUNTER — Encounter (HOSPITAL_COMMUNITY): Payer: Self-pay

## 2014-11-17 DIAGNOSIS — IMO0002 Reserved for concepts with insufficient information to code with codable children: Secondary | ICD-10-CM | POA: Insufficient documentation

## 2014-12-10 ENCOUNTER — Ambulatory Visit (INDEPENDENT_AMBULATORY_CARE_PROVIDER_SITE_OTHER): Payer: Medicaid Other | Admitting: Advanced Practice Midwife

## 2014-12-10 ENCOUNTER — Encounter: Payer: Self-pay | Admitting: Advanced Practice Midwife

## 2014-12-10 VITALS — BP 111/69 | HR 93 | Temp 99.1°F | Wt 142.6 lb

## 2014-12-10 DIAGNOSIS — Z3492 Encounter for supervision of normal pregnancy, unspecified, second trimester: Secondary | ICD-10-CM

## 2014-12-10 DIAGNOSIS — IMO0002 Reserved for concepts with insufficient information to code with codable children: Secondary | ICD-10-CM

## 2014-12-10 LAB — POCT URINALYSIS DIP (DEVICE)
BILIRUBIN URINE: NEGATIVE
Glucose, UA: NEGATIVE mg/dL
Hgb urine dipstick: NEGATIVE
Ketones, ur: NEGATIVE mg/dL
NITRITE: NEGATIVE
Protein, ur: NEGATIVE mg/dL
Specific Gravity, Urine: 1.015 (ref 1.005–1.030)
Urobilinogen, UA: 0.2 mg/dL (ref 0.0–1.0)
pH: 7.5 (ref 5.0–8.0)

## 2014-12-10 NOTE — Patient Instructions (Signed)
Second Trimester of Pregnancy The second trimester is from week 13 through week 28, months 4 through 6. The second trimester is often a time when you feel your best. Your body has also adjusted to being pregnant, and you begin to feel better physically. Usually, morning sickness has lessened or quit completely, you may have more energy, and you may have an increase in appetite. The second trimester is also a time when the fetus is growing rapidly. At the end of the sixth month, the fetus is about 9 inches long and weighs about 1 pounds. You will likely begin to feel the baby move (quickening) between 18 and 20 weeks of the pregnancy. BODY CHANGES Your body goes through many changes during pregnancy. The changes vary from woman to woman.   Your weight will continue to increase. You will notice your lower abdomen bulging out.  You may begin to get stretch marks on your hips, abdomen, and breasts.  You may develop headaches that can be relieved by medicines approved by your health care provider.  You may urinate more often because the fetus is pressing on your bladder.  You may develop or continue to have heartburn as a result of your pregnancy.  You may develop constipation because certain hormones are causing the muscles that push waste through your intestines to slow down.  You may develop hemorrhoids or swollen, bulging veins (varicose veins).  You may have back pain because of the weight gain and pregnancy hormones relaxing your joints between the bones in your pelvis and as a result of a shift in weight and the muscles that support your balance.  Your breasts will continue to grow and be tender.  Your gums may bleed and may be sensitive to brushing and flossing.  Dark spots or blotches (chloasma, mask of pregnancy) may develop on your face. This will likely fade after the baby is born.  A dark line from your belly button to the pubic area (linea nigra) may appear. This will likely fade  after the baby is born.  You may have changes in your hair. These can include thickening of your hair, rapid growth, and changes in texture. Some women also have hair loss during or after pregnancy, or hair that feels dry or thin. Your hair will most likely return to normal after your baby is born. WHAT TO EXPECT AT YOUR PRENATAL VISITS During a routine prenatal visit:  You will be weighed to make sure you and the fetus are growing normally.  Your blood pressure will be taken.  Your abdomen will be measured to track your baby's growth.  The fetal heartbeat will be listened to.  Any test results from the previous visit will be discussed. Your health care provider may ask you:  How you are feeling.  If you are feeling the baby move.  If you have had any abnormal symptoms, such as leaking fluid, bleeding, severe headaches, or abdominal cramping.  If you have any questions. Other tests that may be performed during your second trimester include:  Blood tests that check for:  Low iron levels (anemia).  Gestational diabetes (between 24 and 28 weeks).  Rh antibodies.  Urine tests to check for infections, diabetes, or protein in the urine.  An ultrasound to confirm the proper growth and development of the baby.  An amniocentesis to check for possible genetic problems.  Fetal screens for spina bifida and Down syndrome. HOME CARE INSTRUCTIONS   Avoid all smoking, herbs, alcohol, and unprescribed   drugs. These chemicals affect the formation and growth of the baby.  Follow your health care provider's instructions regarding medicine use. There are medicines that are either safe or unsafe to take during pregnancy.  Exercise only as directed by your health care provider. Experiencing uterine cramps is a good sign to stop exercising.  Continue to eat regular, healthy meals.  Wear a good support bra for breast tenderness.  Do not use hot tubs, steam rooms, or saunas.  Wear your  seat belt at all times when driving.  Avoid raw meat, uncooked cheese, cat litter boxes, and soil used by cats. These carry germs that can cause birth defects in the baby.  Take your prenatal vitamins.  Try taking a stool softener (if your health care provider approves) if you develop constipation. Eat more high-fiber foods, such as fresh vegetables or fruit and whole grains. Drink plenty of fluids to keep your urine clear or pale yellow.  Take warm sitz baths to soothe any pain or discomfort caused by hemorrhoids. Use hemorrhoid cream if your health care provider approves.  If you develop varicose veins, wear support hose. Elevate your feet for 15 minutes, 3-4 times a day. Limit salt in your diet.  Avoid heavy lifting, wear low heel shoes, and practice good posture.  Rest with your legs elevated if you have leg cramps or low back pain.  Visit your dentist if you have not gone yet during your pregnancy. Use a soft toothbrush to brush your teeth and be gentle when you floss.  A sexual relationship may be continued unless your health care provider directs you otherwise.  Continue to go to all your prenatal visits as directed by your health care provider. SEEK MEDICAL CARE IF:   You have dizziness.  You have mild pelvic cramps, pelvic pressure, or nagging pain in the abdominal area.  You have persistent nausea, vomiting, or diarrhea.  You have a bad smelling vaginal discharge.  You have pain with urination. SEEK IMMEDIATE MEDICAL CARE IF:   You have a fever.  You are leaking fluid from your vagina.  You have spotting or bleeding from your vagina.  You have severe abdominal cramping or pain.  You have rapid weight gain or loss.  You have shortness of breath with chest pain.  You notice sudden or extreme swelling of your face, hands, ankles, feet, or legs.  You have not felt your baby move in over an hour.  You have severe headaches that do not go away with  medicine.  You have vision changes. Document Released: 05/24/2001 Document Revised: 06/04/2013 Document Reviewed: 07/31/2012 [employer] Patient Information 2015 [employer]. This information is not intended to replace advice given to you by your health care provider. Make sure you discuss any questions you have with your health care provider.  

## 2014-12-10 NOTE — Progress Notes (Signed)
Doing well. Discussed pyelectasis.  Radiologist recommended f/u US at 28 wks, will schedule  Glucola next visit  Interpretor present

## 2014-12-10 NOTE — Progress Notes (Signed)
Used interpreter  

## 2014-12-24 ENCOUNTER — Ambulatory Visit (HOSPITAL_COMMUNITY)
Admission: RE | Admit: 2014-12-24 | Discharge: 2014-12-24 | Disposition: A | Discharge status: Home / Self Care | Payer: Medicaid Other | Source: Ambulatory Visit | Attending: Advanced Practice Midwife | Admitting: Advanced Practice Midwife

## 2014-12-24 ENCOUNTER — Encounter (HOSPITAL_COMMUNITY): Payer: Self-pay

## 2014-12-24 VITALS — BP 108/67 | HR 87 | Wt 146.0 lb

## 2014-12-24 DIAGNOSIS — Z3402 Encounter for supervision of normal first pregnancy, second trimester: Secondary | ICD-10-CM

## 2014-12-24 DIAGNOSIS — IMO0001 Reserved for inherently not codable concepts without codable children: Secondary | ICD-10-CM

## 2014-12-24 DIAGNOSIS — O35EXX0 Maternal care for other (suspected) fetal abnormality and damage, fetal genitourinary anomalies, not applicable or unspecified: Secondary | ICD-10-CM | POA: Insufficient documentation

## 2014-12-24 DIAGNOSIS — Z3A27 27 weeks gestation of pregnancy: Secondary | ICD-10-CM | POA: Insufficient documentation

## 2014-12-24 DIAGNOSIS — O358XX0 Maternal care for other (suspected) fetal abnormality and damage, not applicable or unspecified: Secondary | ICD-10-CM | POA: Diagnosis not present

## 2014-12-24 DIAGNOSIS — IMO0002 Reserved for concepts with insufficient information to code with codable children: Secondary | ICD-10-CM

## 2014-12-24 DIAGNOSIS — O358XX1 Maternal care for other (suspected) fetal abnormality and damage, fetus 1: Secondary | ICD-10-CM

## 2015-01-07 ENCOUNTER — Ambulatory Visit (INDEPENDENT_AMBULATORY_CARE_PROVIDER_SITE_OTHER): Payer: Medicaid Other | Admitting: Family

## 2015-01-07 VITALS — BP 119/71 | HR 99 | Temp 98.6°F | Wt 147.6 lb

## 2015-01-07 DIAGNOSIS — Z3493 Encounter for supervision of normal pregnancy, unspecified, third trimester: Secondary | ICD-10-CM

## 2015-01-07 DIAGNOSIS — Z23 Encounter for immunization: Secondary | ICD-10-CM

## 2015-01-07 DIAGNOSIS — Z3402 Encounter for supervision of normal first pregnancy, second trimester: Secondary | ICD-10-CM

## 2015-01-07 LAB — POCT URINALYSIS DIP (DEVICE)
Bilirubin Urine: NEGATIVE
Glucose, UA: NEGATIVE mg/dL
HGB URINE DIPSTICK: NEGATIVE
Ketones, ur: NEGATIVE mg/dL
Leukocytes, UA: NEGATIVE
NITRITE: NEGATIVE
PH: 7 (ref 5.0–8.0)
PROTEIN: NEGATIVE mg/dL
SPECIFIC GRAVITY, URINE: 1.02 (ref 1.005–1.030)
Urobilinogen, UA: 0.2 mg/dL (ref 0.0–1.0)

## 2015-01-07 LAB — CBC
HEMATOCRIT: 33.2 % — AB (ref 36.0–46.0)
Hemoglobin: 11.1 g/dL — ABNORMAL LOW (ref 12.0–15.0)
MCH: 30.6 pg (ref 26.0–34.0)
MCHC: 33.4 g/dL (ref 30.0–36.0)
MCV: 91.5 fL (ref 78.0–100.0)
MPV: 10.3 fL (ref 8.6–12.4)
Platelets: 257 10*3/uL (ref 150–400)
RBC: 3.63 MIL/uL — ABNORMAL LOW (ref 3.87–5.11)
RDW: 13.6 % (ref 11.5–15.5)
WBC: 7 10*3/uL (ref 4.0–10.5)

## 2015-01-07 MED ORDER — CONCEPT DHA 53.5-38-1 MG PO CAPS: 1.0000 | ORAL_CAPSULE | Freq: Every day | ORAL | Status: DC

## 2015-01-07 MED ORDER — TETANUS-DIPHTH-ACELL PERTUSSIS 5-2.5-18.5 LF-MCG/0.5 IM SUSP
0.5000 mL | Freq: Once | INTRAMUSCULAR | Status: AC
  Administered 2015-01-07: 0.5 mL via INTRAMUSCULAR

## 2015-01-07 NOTE — Progress Notes (Signed)
Subjective:  Pam Kennedy is a 34 y.o. G2P0010 at [redacted]w[redacted]d being seen today for ongoing prenatal care.  Patient reports no complaints.  Contractions: Not present.  Vag. Bleeding: None. Movement: Present. Denies leaking of fluid.   The following portions of the patient's history were reviewed and updated as appropriate: allergies, current medications, past family history, past medical history, past social history, past surgical history and problem list.   Objective:   Filed Vitals:   01/07/15 1030  BP: 119/71  Pulse: 99  Temp: 98.6 F (37 C)  Weight: 147 lb 9.6 oz (66.951 kg)    Fetal Status: Fetal Heart Rate (bpm): 140 Fundal Height: 30 cm Movement: Present     General:  Alert, oriented and cooperative. Patient is in no acute distress.  Skin: Skin is warm and dry. No rash noted.   Cardiovascular: Normal heart rate noted  Respiratory: Normal respiratory effort, no problems with respiration noted  Abdomen: Soft, gravid, appropriate for gestational age. Pain/Pressure: Present     Vaginal: Vag. Bleeding: None.       Cervix: Not evaluated        Extremities: Normal range of motion.  Edema: Trace  Mental Status: Normal mood and affect. Normal behavior. Normal judgment and thought content.   Urinalysis: Urine Protein: Negative Urine Glucose: Negative  Assessment and Plan:  Pregnancy: G2P0010 at [redacted]w[redacted]d  1. Prenatal care in third trimester - Glucose Tolerance, 1 HR (50g) w/o Fasting - CBC - RPR - HIV antibody (with reflex)  2. Need for Tdap vaccination - Tdap (BOOSTRIX) injection 0.5 mL; Inject 0.5 mLs into the muscle once.  3. Supervision of normal first pregnancy in second trimester - Reviewed follow-up ultrasound results (resolved pylectasis)  Preterm labor symptoms and general obstetric precautions including but not limited to vaginal bleeding, contractions, leaking of fluid and fetal movement were reviewed in detail with the patient. Please refer to After Visit Summary for other  counseling recommendations.  Return in about 2 weeks (around 01/21/2015).  All information obtained and given by interpreter present in the room.   Eino Farber Kennith Gain, CNM

## 2015-01-07 NOTE — Progress Notes (Signed)
Interpreter present for encounter 28 wk labs with 1 hour gtt Tdap vaccine given 28 wk educational material given

## 2015-01-08 LAB — HIV ANTIBODY (ROUTINE TESTING W REFLEX): HIV: NONREACTIVE

## 2015-01-08 LAB — GLUCOSE TOLERANCE, 1 HOUR (50G) W/O FASTING: Glucose, 1 Hour GTT: 120 mg/dL (ref 70–140)

## 2015-01-08 LAB — RPR

## 2015-01-21 ENCOUNTER — Ambulatory Visit (INDEPENDENT_AMBULATORY_CARE_PROVIDER_SITE_OTHER): Payer: Medicaid Other | Admitting: Advanced Practice Midwife

## 2015-01-21 VITALS — BP 119/70 | HR 89 | Temp 98.5°F | Wt 147.2 lb

## 2015-01-21 DIAGNOSIS — Z3402 Encounter for supervision of normal first pregnancy, second trimester: Secondary | ICD-10-CM

## 2015-01-21 LAB — POCT URINALYSIS DIP (DEVICE)
Bilirubin Urine: NEGATIVE
Glucose, UA: NEGATIVE mg/dL
Hgb urine dipstick: NEGATIVE
KETONES UR: NEGATIVE mg/dL
Nitrite: NEGATIVE
Protein, ur: NEGATIVE mg/dL
Specific Gravity, Urine: 1.02 (ref 1.005–1.030)
Urobilinogen, UA: 0.2 mg/dL (ref 0.0–1.0)
pH: 7 (ref 5.0–8.0)

## 2015-01-21 NOTE — Patient Instructions (Addendum)
Preterm Labor Information Preterm labor is when labor starts at less than 37 weeks of pregnancy. The normal length of a pregnancy is 39 to 41 weeks. CAUSES Often, there is no identifiable underlying cause as to why a woman goes into preterm labor. One of the most common known causes of preterm labor is infection. Infections of the uterus, cervix, vagina, amniotic sac, bladder, kidney, or even the lungs (pneumonia) can cause labor to start. Other suspected causes of preterm labor include:   Urogenital infections, such as yeast infections and bacterial vaginosis.   Uterine abnormalities (uterine shape, uterine septum, fibroids, or bleeding from the placenta).   A cervix that has been operated on (it may fail to stay closed).   Malformations in the fetus.   Multiple gestations (twins, triplets, and so on).   Breakage of the amniotic sac.  RISK FACTORS  Having a previous history of preterm labor.   Having premature rupture of membranes (PROM).   Having a placenta that covers the opening of the cervix (placenta previa).   Having a placenta that separates from the uterus (placental abruption).   Having a cervix that is too weak to hold the fetus in the uterus (incompetent cervix).   Having too much fluid in the amniotic sac (polyhydramnios).   Taking illegal drugs or smoking while pregnant.   Not gaining enough weight while pregnant.   Being younger than 18 and older than 33 years old.   Having a low socioeconomic status.   Being African American. SYMPTOMS Signs and symptoms of preterm labor include:   Menstrual-like cramps, abdominal pain, or back pain.  Uterine contractions that are regular, as frequent as six in an hour, regardless of their intensity (may be mild or painful).  Contractions that start on the top of the uterus and spread down to the lower abdomen and back.   A sense of increased pelvic pressure.   A watery or bloody mucus discharge that  comes from the vagina.  TREATMENT Depending on the length of the pregnancy and other circumstances, your health care provider may suggest bed rest. If necessary, there are medicines that can be given to stop contractions and to mature the fetal lungs. If labor happens before 34 weeks of pregnancy, a prolonged hospital stay may be recommended. Treatment depends on the condition of both you and the fetus.  WHAT SHOULD YOU DO IF YOU THINK YOU ARE IN PRETERM LABOR? Call your health care provider right away. You will need to go to the hospital to get checked immediately. HOW CAN YOU PREVENT PRETERM LABOR IN FUTURE PREGNANCIES? You should:   Stop smoking if you smoke.  Maintain healthy weight gain and avoid chemicals and drugs that are not necessary.  Be watchful for any type of infection.  Inform your health care provider if you have a known history of preterm labor. Document Released: 08/20/2003 Document Revised: 01/30/2013 Document Reviewed: 07/02/2012 [employer] Patient Information 2015 [employer]. This information is not intended to replace advice given to you by your health care provider. Make sure you discuss any questions you have with your health care provider.  Breastfeeding Deciding to breastfeed is one of the best choices you can make for you and your baby. A change in hormones during pregnancy causes your breast tissue to grow and increases the number and size of your milk ducts. These hormones also allow proteins, sugars, and fats from your blood supply to make breast milk in your milk-producing glands. Hormones prevent breast milk   from being released before your baby is born as well as prompt milk flow after birth. Once breastfeeding has begun, thoughts of your baby, as well as his or her sucking or crying, can stimulate the release of milk from your milk-producing glands.  BENEFITS OF BREASTFEEDING For Your Baby  Your first milk (colostrum) helps your baby's digestive system  function better.   There are antibodies in your milk that help your baby fight off infections.   Your baby has a lower incidence of asthma, allergies, and sudden infant death syndrome.   The nutrients in breast milk are better for your baby than infant formulas and are designed uniquely for your baby's needs.   Breast milk improves your baby's brain development.   Your baby is less likely to develop other conditions, such as childhood obesity, asthma, or type 2 diabetes mellitus.  For You   Breastfeeding helps to create a very special bond between you and your baby.   Breastfeeding is convenient. Breast milk is always available at the correct temperature and costs nothing.   Breastfeeding helps to burn calories and helps you lose the weight gained during pregnancy.   Breastfeeding makes your uterus contract to its prepregnancy size faster and slows bleeding (lochia) after you give birth.   Breastfeeding helps to lower your risk of developing type 2 diabetes mellitus, osteoporosis, and breast or ovarian cancer later in life. SIGNS THAT YOUR BABY IS HUNGRY Early Signs of Hunger  Increased alertness or activity.  Stretching.  Movement of the head from side to side.  Movement of the head and opening of the mouth when the corner of the mouth or cheek is stroked (rooting).  Increased sucking sounds, smacking lips, cooing, sighing, or squeaking.  Hand-to-mouth movements.  Increased sucking of fingers or hands. Late Signs of Hunger  Fussing.  Intermittent crying. Extreme Signs of Hunger Signs of extreme hunger will require calming and consoling before your baby will be able to breastfeed successfully. Do not wait for the following signs of extreme hunger to occur before you initiate breastfeeding:   Restlessness.  A loud, strong cry.   Screaming. BREASTFEEDING BASICS Breastfeeding Initiation  Find a comfortable place to sit or lie down, with your neck and  back well supported.  Place a pillow or rolled up blanket under your baby to bring him or her to the level of your breast (if you are seated). Nursing pillows are specially designed to help support your arms and your baby while you breastfeed.  Make sure that your baby's abdomen is facing your abdomen.   Gently massage your breast. With your fingertips, massage from your chest wall toward your nipple in a circular motion. This encourages milk flow. You may need to continue this action during the feeding if your milk flows slowly.  Support your breast with 4 fingers underneath and your thumb above your nipple. Make sure your fingers are well away from your nipple and your baby's mouth.   Stroke your baby's lips gently with your finger or nipple.   When your baby's mouth is open wide enough, quickly bring your baby to your breast, placing your entire nipple and as much of the colored area around your nipple (areola) as possible into your baby's mouth.   More areola should be visible above your baby's upper lip than below the lower lip.   Your baby's tongue should be between his or her lower gum and your breast.   Ensure that your baby's mouth is  correctly positioned around your nipple (latched). Your baby's lips should create a seal on your breast and be turned out (everted).  It is common for your baby to suck about 2-3 minutes in order to start the flow of breast milk. Latching Teaching your baby how to latch on to your breast properly is very important. An improper latch can cause nipple pain and decreased milk supply for you and poor weight gain in your baby. Also, if your baby is not latched onto your nipple properly, he or she may swallow some air during feeding. This can make your baby fussy. Burping your baby when you switch breasts during the feeding can help to get rid of the air. However, teaching your baby to latch on properly is still the best way to prevent fussiness from  swallowing air while breastfeeding. Signs that your baby has successfully latched on to your nipple:    Silent tugging or silent sucking, without causing you pain.   Swallowing heard between every 3-4 sucks.    Muscle movement above and in front of his or her ears while sucking.  Signs that your baby has not successfully latched on to nipple:   Sucking sounds or smacking sounds from your baby while breastfeeding.  Nipple pain. If you think your baby has not latched on correctly, slip your finger into the corner of your baby's mouth to break the suction and place it between your baby's gums. Attempt breastfeeding initiation again. Signs of Successful Breastfeeding Signs from your baby:   A gradual decrease in the number of sucks or complete cessation of sucking.   Falling asleep.   Relaxation of his or her body.   Retention of a small amount of milk in his or her mouth.   Letting go of your breast by himself or herself. Signs from you:  Breasts that have increased in firmness, weight, and size 1-3 hours after feeding.   Breasts that are softer immediately after breastfeeding.  Increased milk volume, as well as a change in milk consistency and color by the fifth day of breastfeeding.   Nipples that are not sore, cracked, or bleeding. Signs That Your Baby is Getting Enough Milk  Wetting at least 3 diapers in a 24-hour period. The urine should be clear and pale yellow by age 5 days.  At least 3 stools in a 24-hour period by age 5 days. The stool should be soft and yellow.  At least 3 stools in a 24-hour period by age 7 days. The stool should be seedy and yellow.  No loss of weight greater than 10% of birth weight during the first 3 days of age.  Average weight gain of 4-7 ounces (113-198 g) per week after age 4 days.  Consistent daily weight gain by age 5 days, without weight loss after the age of 2 weeks. After a feeding, your baby may spit up a small amount.  This is common. BREASTFEEDING FREQUENCY AND DURATION Frequent feeding will help you make more milk and can prevent sore nipples and breast engorgement. Breastfeed when you feel the need to reduce the fullness of your breasts or when your baby shows signs of hunger. This is called "breastfeeding on demand." Avoid introducing a pacifier to your baby while you are working to establish breastfeeding (the first 4-6 weeks after your baby is born). After this time you may choose to use a pacifier. Research has shown that pacifier use during the first year of a baby's life decreases the   risk of sudden infant death syndrome (SIDS). Allow your baby to feed on each breast as long as he or she wants. Breastfeed until your baby is finished feeding. When your baby unlatches or falls asleep while feeding from the first breast, offer the second breast. Because newborns are often sleepy in the first few weeks of life, you may need to awaken your baby to get him or her to feed. Breastfeeding times will vary from baby to baby. However, the following rules can serve as a guide to help you ensure that your baby is properly fed:  Newborns (babies 4 weeks of age or younger) may breastfeed every 1-3 hours.  Newborns should not go longer than 3 hours during the day or 5 hours during the night without breastfeeding.  You should breastfeed your baby a minimum of 8 times in a 24-hour period until you begin to introduce solid foods to your baby at around 6 months of age. BREAST MILK PUMPING Pumping and storing breast milk allows you to ensure that your baby is exclusively fed your breast milk, even at times when you are unable to breastfeed. This is especially important if you are going back to work while you are still breastfeeding or when you are not able to be present during feedings. Your lactation consultant can give you guidelines on how long it is safe to store breast milk.  A breast pump is a machine that allows you to pump  milk from your breast into a sterile bottle. The pumped breast milk can then be stored in a refrigerator or freezer. Some breast pumps are operated by hand, while others use electricity. Ask your lactation consultant which type will work best for you. Breast pumps can be purchased, but some hospitals and breastfeeding support groups lease breast pumps on a monthly basis. A lactation consultant can teach you how to hand express breast milk, if you prefer not to use a pump.  CARING FOR YOUR BREASTS WHILE YOU BREASTFEED Nipples can become dry, cracked, and sore while breastfeeding. The following recommendations can help keep your breasts moisturized and healthy:  Avoid using soap on your nipples.   Wear a supportive bra. Although not required, special nursing bras and tank tops are designed to allow access to your breasts for breastfeeding without taking off your entire bra or top. Avoid wearing underwire-style bras or extremely tight bras.  Air dry your nipples for 3-4minutes after each feeding.   Use only cotton bra pads to absorb leaked breast milk. Leaking of breast milk between feedings is normal.   Use lanolin on your nipples after breastfeeding. Lanolin helps to maintain your skin's normal moisture barrier. If you use pure lanolin, you do not need to wash it off before feeding your baby again. Pure lanolin is not toxic to your baby. You may also hand express a few drops of breast milk and gently massage that milk into your nipples and allow the milk to air dry. In the first few weeks after giving birth, some women experience extremely full breasts (engorgement). Engorgement can make your breasts feel heavy, warm, and tender to the touch. Engorgement peaks within 3-5 days after you give birth. The following recommendations can help ease engorgement:  Completely empty your breasts while breastfeeding or pumping. You may want to start by applying warm, moist heat (in the shower or with warm  water-soaked hand towels) just before feeding or pumping. This increases circulation and helps the milk flow. If your baby does not   completely empty your breasts while breastfeeding, pump any extra milk after he or she is finished.  Wear a snug bra (nursing or regular) or tank top for 1-2 days to signal your body to slightly decrease milk production.  Apply ice packs to your breasts, unless this is too uncomfortable for you.  Make sure that your baby is latched on and positioned properly while breastfeeding. If engorgement persists after 48 hours of following these recommendations, contact your health care provider or a lactation consultant. OVERALL HEALTH CARE RECOMMENDATIONS WHILE BREASTFEEDING  Eat healthy foods. Alternate between meals and snacks, eating 3 of each per day. Because what you eat affects your breast milk, some of the foods may make your baby more irritable than usual. Avoid eating these foods if you are sure that they are negatively affecting your baby.  Drink milk, fruit juice, and water to satisfy your thirst (about 10 glasses a day).   Rest often, relax, and continue to take your prenatal vitamins to prevent fatigue, stress, and anemia.  Continue breast self-awareness checks.  Avoid chewing and smoking tobacco.  Avoid alcohol and drug use. Some medicines that may be harmful to your baby can pass through breast milk. It is important to ask your health care provider before taking any medicine, including all over-the-counter and prescription medicine as well as vitamin and herbal supplements. It is possible to become pregnant while breastfeeding. If birth control is desired, ask your health care provider about options that will be safe for your baby. SEEK MEDICAL CARE IF:   You feel like you want to stop breastfeeding or have become frustrated with breastfeeding.  You have painful breasts or nipples.  Your nipples are cracked or bleeding.  Your breasts are red,  tender, or warm.  You have a swollen area on either breast.  You have a fever or chills.  You have nausea or vomiting.  You have drainage other than breast milk from your nipples.  Your breasts do not become full before feedings by the fifth day after you give birth.  You feel sad and depressed.  Your baby is too sleepy to eat well.  Your baby is having trouble sleeping.   Your baby is wetting less than 3 diapers in a 24-hour period.  Your baby has less than 3 stools in a 24-hour period.  Your baby's skin or the white part of his or her eyes becomes yellow.   Your baby is not gaining weight by 5 days of age. SEEK IMMEDIATE MEDICAL CARE IF:   Your baby is overly tired (lethargic) and does not want to wake up and feed.  Your baby develops an unexplained fever. Document Released: 05/30/2005 Document Revised: 06/04/2013 Document Reviewed: 11/21/2012 [employer] Patient Information 2015 [employer]. This information is not intended to replace advice given to you by your health care provider. Make sure you discuss any questions you have with your health care provider.  

## 2015-01-21 NOTE — Progress Notes (Signed)
Subjective:  Pam Kennedy is a 34 y.o. G2P0010 at [redacted]w[redacted]d being seen today for ongoing prenatal care.  Patient reports no complaints.  Contractions: Not present.  Vag. Bleeding: None. Movement: Present. Denies leaking of fluid.   The following portions of the patient's history were reviewed and updated as appropriate: allergies, current medications, past family history, past medical history, past social history, past surgical history and problem list.   Objective:   Filed Vitals:   01/21/15 1032  BP: 119/70  Pulse: 89  Temp: 98.5 F (36.9 C)  Weight: 147 lb 3.2 oz (66.769 kg)    Fetal Status: Fetal Heart Rate (bpm): 137   Movement: Present     General:  Alert, oriented and cooperative. Patient is in no acute distress.  Skin: Skin is warm and dry. No rash noted.   Cardiovascular: Normal heart rate noted  Respiratory: Normal respiratory effort, no problems with respiration noted  Abdomen: Soft, gravid, appropriate for gestational age. Pain/Pressure: Present     Pelvic: Vag. Bleeding: None     Cervical exam deferred        Extremities: Normal range of motion.  Edema: Trace  Mental Status: Normal mood and affect. Normal behavior. Normal judgment and thought content.   Urinalysis: Urine Protein: Negative Urine Glucose: Negative  Assessment and Plan:  Pregnancy: G2P0010 at [redacted]w[redacted]d  1. Supervision of normal first pregnancy in second trimester  Preterm labor symptoms and general obstetric precautions including but not limited to vaginal bleeding, contractions, leaking of fluid and fetal movement were reviewed in detail with the patient. Please refer to After Visit Summary for other counseling recommendations.  F/U 2 weeks    Dorathy Kinsman, [place]

## 2015-02-04 ENCOUNTER — Ambulatory Visit (INDEPENDENT_AMBULATORY_CARE_PROVIDER_SITE_OTHER): Payer: Medicaid Other | Admitting: Certified Nurse Midwife

## 2015-02-04 VITALS — BP 115/70 | HR 91 | Temp 98.5°F | Wt 151.0 lb

## 2015-02-04 DIAGNOSIS — Z3493 Encounter for supervision of normal pregnancy, unspecified, third trimester: Secondary | ICD-10-CM

## 2015-02-04 LAB — POCT URINALYSIS DIP (DEVICE)
Bilirubin Urine: NEGATIVE
Glucose, UA: NEGATIVE mg/dL
Hgb urine dipstick: NEGATIVE
Ketones, ur: NEGATIVE mg/dL
Nitrite: NEGATIVE
Protein, ur: NEGATIVE mg/dL
Specific Gravity, Urine: 1.02 (ref 1.005–1.030)
Urobilinogen, UA: 0.2 mg/dL (ref 0.0–1.0)
pH: 7 (ref 5.0–8.0)

## 2015-02-04 NOTE — Progress Notes (Signed)
Subjective:  Pam Kennedy is a 34 y.o. G2P0010 at [redacted]w[redacted]d being seen today for ongoing prenatal care.  Patient reports braxton hicks.  Contractions: Not present.  Vag. Bleeding: None. Movement: Present. Denies leaking of fluid.   The following portions of the patient's history were reviewed and updated as appropriate: allergies, current medications, past family history, past medical history, past social history, past surgical history and problem list.   Objective:   Filed Vitals:   02/04/15 1023  BP: 115/70  Pulse: 91  Temp: 98.5 F (36.9 C)  Weight: 151 lb (68.493 kg)    Fetal Status: Fetal Heart Rate (bpm): 147   Movement: Present     General:  Alert, oriented and cooperative. Patient is in no acute distress.  Skin: Skin is warm and dry. No rash noted.   Cardiovascular: Normal heart rate noted  Respiratory: Normal respiratory effort, no problems with respiration noted  Abdomen: Soft, gravid, appropriate for gestational age. Pain/Pressure: Present     Pelvic: Vag. Bleeding: None     Cervical exam deferred        Extremities: Normal range of motion.  Edema: Trace  Mental Status: Normal mood and affect. Normal behavior. Normal judgment and thought content.   Urinalysis: Urine Protein: Negative Urine Glucose: Negative  Assessment and Plan:  Pregnancy: G2P0010 at [redacted]w[redacted]d  There are no diagnoses linked to this encounter. Preterm labor symptoms and general obstetric precautions including but not limited to vaginal bleeding, contractions, leaking of fluid and fetal movement were reviewed in detail with the patient. Please refer to After Visit Summary for other counseling recommendations.  Return in about 2 weeks (around 02/18/2015).   Rhea Pink, CNM

## 2015-02-04 NOTE — Patient Instructions (Signed)
Third Trimester of Pregnancy The third trimester is from week 29 through week 42, months 7 through 9. The third trimester is a time when the fetus is growing rapidly. At the end of the ninth month, the fetus is about 20 inches in length and weighs 6-10 pounds.  BODY CHANGES Your body goes through many changes during pregnancy. The changes vary from woman to woman.   Your weight will continue to increase. You can expect to gain 25-35 pounds (11-16 kg) by the end of the pregnancy.  You may begin to get stretch marks on your hips, abdomen, and breasts.  You may urinate more often because the fetus is moving lower into your pelvis and pressing on your bladder.  You may develop or continue to have heartburn as a result of your pregnancy.  You may develop constipation because certain hormones are causing the muscles that push waste through your intestines to slow down.  You may develop hemorrhoids or swollen, bulging veins (varicose veins).  You may have pelvic pain because of the weight gain and pregnancy hormones relaxing your joints between the bones in your pelvis. Backaches may result from overexertion of the muscles supporting your posture.  You may have changes in your hair. These can include thickening of your hair, rapid growth, and changes in texture. Some women also have hair loss during or after pregnancy, or hair that feels dry or thin. Your hair will most likely return to normal after your baby is born.  Your breasts will continue to grow and be tender. A yellow discharge may leak from your breasts called colostrum.  Your belly button may stick out.  You may feel short of breath because of your expanding uterus.  You may notice the fetus "dropping," or moving lower in your abdomen.  You may have a bloody mucus discharge. This usually occurs a few days to a week before labor begins.  Your cervix becomes thin and soft (effaced) near your due date. WHAT TO EXPECT AT YOUR PRENATAL  EXAMS  You will have prenatal exams every 2 weeks until week 36. Then, you will have weekly prenatal exams. During a routine prenatal visit:  You will be weighed to make sure you and the fetus are growing normally.  Your blood pressure is taken.  Your abdomen will be measured to track your baby's growth.  The fetal heartbeat will be listened to.  Any test results from the previous visit will be discussed.  You may have a cervical check near your due date to see if you have effaced. At around 36 weeks, your caregiver will check your cervix. At the same time, your caregiver will also perform a test on the secretions of the vaginal tissue. This test is to determine if a type of bacteria, Group B streptococcus, is present. Your caregiver will explain this further. Your caregiver may ask you:  What your birth plan is.  How you are feeling.  If you are feeling the baby move.  If you have had any abnormal symptoms, such as leaking fluid, bleeding, severe headaches, or abdominal cramping.  If you have any questions. Other tests or screenings that may be performed during your third trimester include:  Blood tests that check for low iron levels (anemia).  Fetal testing to check the health, activity level, and growth of the fetus. Testing is done if you have certain medical conditions or if there are problems during the pregnancy. FALSE LABOR You may feel small, irregular contractions that   eventually go away. These are called Braxton Hicks contractions, or false labor. Contractions may last for hours, days, or even weeks before true labor sets in. If contractions come at regular intervals, intensify, or become painful, it is best to be seen by your caregiver.  SIGNS OF LABOR   Menstrual-like cramps.  Contractions that are 5 minutes apart or less.  Contractions that start on the top of the uterus and spread down to the lower abdomen and back.  A sense of increased pelvic pressure or back  pain.  A watery or bloody mucus discharge that comes from the vagina. If you have any of these signs before the 37th week of pregnancy, call your caregiver right away. You need to go to the hospital to get checked immediately. HOME CARE INSTRUCTIONS   Avoid all smoking, herbs, alcohol, and unprescribed drugs. These chemicals affect the formation and growth of the baby.  Follow your caregiver's instructions regarding medicine use. There are medicines that are either safe or unsafe to take during pregnancy.  Exercise only as directed by your caregiver. Experiencing uterine cramps is a good sign to stop exercising.  Continue to eat regular, healthy meals.  Wear a good support bra for breast tenderness.  Do not use hot tubs, steam rooms, or saunas.  Wear your seat belt at all times when driving.  Avoid raw meat, uncooked cheese, cat litter boxes, and soil used by cats. These carry germs that can cause birth defects in the baby.  Take your prenatal vitamins.  Try taking a stool softener (if your caregiver approves) if you develop constipation. Eat more high-fiber foods, such as fresh vegetables or fruit and whole grains. Drink plenty of fluids to keep your urine clear or pale yellow.  Take warm sitz baths to soothe any pain or discomfort caused by hemorrhoids. Use hemorrhoid cream if your caregiver approves.  If you develop varicose veins, wear support hose. Elevate your feet for 15 minutes, 3-4 times a day. Limit salt in your diet.  Avoid heavy lifting, wear low heal shoes, and practice good posture.  Rest a lot with your legs elevated if you have leg cramps or low back pain.  Visit your dentist if you have not gone during your pregnancy. Use a soft toothbrush to brush your teeth and be gentle when you floss.  A sexual relationship may be continued unless your caregiver directs you otherwise.  Do not travel far distances unless it is absolutely necessary and only with the approval  of your caregiver.  Take prenatal classes to understand, practice, and ask questions about the labor and delivery.  Make a trial run to the hospital.  Pack your hospital bag.  Prepare the baby's nursery.  Continue to go to all your prenatal visits as directed by your caregiver. SEEK MEDICAL CARE IF:  You are unsure if you are in labor or if your water has broken.  You have dizziness.  You have mild pelvic cramps, pelvic pressure, or nagging pain in your abdominal area.  You have persistent nausea, vomiting, or diarrhea.  You have a bad smelling vaginal discharge.  You have pain with urination. SEEK IMMEDIATE MEDICAL CARE IF:   You have a fever.  You are leaking fluid from your vagina.  You have spotting or bleeding from your vagina.  You have severe abdominal cramping or pain.  You have rapid weight loss or gain.  You have shortness of breath with chest pain.  You notice sudden or extreme swelling   of your face, hands, ankles, feet, or legs.  You have not felt your baby move in over an hour.  You have severe headaches that do not go away with medicine.  You have vision changes. Document Released: 05/24/2001 Document Revised: 06/04/2013 Document Reviewed: 07/31/2012 Exit[employer] Patient Information 2015 [employer]. This information is not intended to replace advice given to you by your health care provider. Make sure you discuss any questions you have with your health care provider.  

## 2015-02-04 NOTE — Progress Notes (Signed)
Interpreter Alfonzo Beers Pressure- baby postioning  Educated pt on Benefits of Breastfeeding for Mom

## 2015-02-18 ENCOUNTER — Ambulatory Visit (INDEPENDENT_AMBULATORY_CARE_PROVIDER_SITE_OTHER): Payer: Medicaid Other | Admitting: Obstetrics and Gynecology

## 2015-02-18 ENCOUNTER — Other Ambulatory Visit (HOSPITAL_COMMUNITY)
Admission: RE | Admit: 2015-02-18 | Discharge: 2015-02-18 | Disposition: A | Discharge status: Home / Self Care | Payer: Medicaid Other | Source: Ambulatory Visit | Attending: Obstetrics and Gynecology | Admitting: Obstetrics and Gynecology

## 2015-02-18 VITALS — BP 112/68 | HR 101 | Temp 98.2°F | Wt 153.5 lb

## 2015-02-18 DIAGNOSIS — Z3493 Encounter for supervision of normal pregnancy, unspecified, third trimester: Secondary | ICD-10-CM | POA: Diagnosis present

## 2015-02-18 DIAGNOSIS — Z113 Encounter for screening for infections with a predominantly sexual mode of transmission: Secondary | ICD-10-CM | POA: Diagnosis not present

## 2015-02-18 DIAGNOSIS — Z23 Encounter for immunization: Secondary | ICD-10-CM

## 2015-02-18 DIAGNOSIS — N764 Abscess of vulva: Secondary | ICD-10-CM | POA: Diagnosis not present

## 2015-02-18 DIAGNOSIS — Z3402 Encounter for supervision of normal first pregnancy, second trimester: Secondary | ICD-10-CM

## 2015-02-18 LAB — POCT URINALYSIS DIP (DEVICE)
Bilirubin Urine: NEGATIVE
GLUCOSE, UA: NEGATIVE mg/dL
Hgb urine dipstick: NEGATIVE
Ketones, ur: NEGATIVE mg/dL
NITRITE: NEGATIVE
PROTEIN: NEGATIVE mg/dL
Specific Gravity, Urine: 1.015 (ref 1.005–1.030)
Urobilinogen, UA: 0.2 mg/dL (ref 0.0–1.0)
pH: 7 (ref 5.0–8.0)

## 2015-02-18 LAB — OB RESULTS CONSOLE GC/CHLAMYDIA: GC PROBE AMP, GENITAL: NEGATIVE

## 2015-02-18 LAB — OB RESULTS CONSOLE GBS: GBS: NEGATIVE

## 2015-02-18 NOTE — Progress Notes (Signed)
Procedure I and d left vulvar abscess Verbal informed consent obtained Site prepped 2.5 ml 1% lidocaine w/o epinephrine infused Stab incision with #11 scalpel Small amount pus expressed Sterile water flush Hemostatic Sterile dressing Pt tolerated procedure well, no complications

## 2015-02-18 NOTE — Addendum Note (Signed)
Addended by: Faythe Casa on: 02/18/2015 12:17 PM   Modules accepted: Orders

## 2015-02-18 NOTE — Progress Notes (Signed)
Subjective:  Pam Kennedy is a 34 y.o. G2P0010 at [redacted]w[redacted]d being seen today for ongoing prenatal care.  Patient reports a bump on her left vulva present for 24 hours. No fever or chills, doesn't shave.  Contractions: Irritability.  Vag. Bleeding: None. Movement: Present. Denies leaking of fluid.   The following portions of the patient's history were reviewed and updated as appropriate: allergies, current medications, past family history, past medical history, past social history, past surgical history and problem list.   Objective:   Filed Vitals:   02/18/15 1118  BP: 112/68  Pulse: 101  Temp: 98.2 F (36.8 C)  Weight: 153 lb 8 oz (69.627 kg)    Fetal Status: Fetal Heart Rate (bpm): 169 Fundal Height: 37 cm Movement: Present  Presentation: Vertex  General:  Alert, oriented and cooperative. Patient is in no acute distress.  Skin: Skin is warm and dry. No rash noted.   Cardiovascular: Normal heart rate noted  Respiratory: Normal respiratory effort, no problems with respiration noted  Abdomen: Soft, gravid, appropriate for gestational age. Pain/Pressure: Present     Pelvic: 1 cm abscess left vulva no surrounding erythema or redness  Extremities: Normal range of motion.  Edema: Trace  Mental Status: Normal mood and affect. Normal behavior. Normal judgment and thought content.   Urinalysis: Urine Protein: Negative Urine Glucose: Negative    Assessment and Plan:  Pregnancy: G2P0010 at [redacted]w[redacted]d  # Pregnancy - flu vaccine today  # Simple abscess, vulva - successful i and d today, no surrounding cellulitis and very small so no antibiotics. Infection return precautions discussed.  There are no diagnoses linked to this encounter. Preterm labor symptoms and general obstetric precautions including but not limited to vaginal bleeding, contractions, leaking of fluid and fetal movement were reviewed in detail with the patient. Please refer to After Visit Summary for other counseling recommendations.   Return in about 1 week (around 02/25/2015).   Kathrynn Running, MD

## 2015-02-18 NOTE — Progress Notes (Signed)
Interpreter Alfonzo Beers 36 wk culture

## 2015-02-19 LAB — GC/CHLAMYDIA PROBE AMP (~~LOC~~) NOT AT ARMC
CHLAMYDIA, DNA PROBE: NEGATIVE
Neisseria Gonorrhea: NEGATIVE

## 2015-02-20 LAB — CULTURE, BETA STREP (GROUP B ONLY)

## 2015-02-25 ENCOUNTER — Ambulatory Visit (INDEPENDENT_AMBULATORY_CARE_PROVIDER_SITE_OTHER): Payer: Medicaid Other | Admitting: Certified Nurse Midwife

## 2015-02-25 VITALS — BP 109/78 | HR 100 | Temp 98.0°F | Wt 154.7 lb

## 2015-02-25 DIAGNOSIS — O09623 Supervision of young multigravida, third trimester: Secondary | ICD-10-CM | POA: Diagnosis present

## 2015-02-25 LAB — POCT URINALYSIS DIP (DEVICE)
Bilirubin Urine: NEGATIVE
Glucose, UA: NEGATIVE mg/dL
Hgb urine dipstick: NEGATIVE
Ketones, ur: NEGATIVE mg/dL
Nitrite: NEGATIVE
Protein, ur: NEGATIVE mg/dL
Specific Gravity, Urine: 1.02 (ref 1.005–1.030)
Urobilinogen, UA: 0.2 mg/dL (ref 0.0–1.0)
pH: 7 (ref 5.0–8.0)

## 2015-02-25 NOTE — Progress Notes (Signed)
Subjective:  Pam Kennedy is a 34 y.o. G2P0010 at [redacted]w[redacted]d being seen today for ongoing prenatal care.  Patient reports no complaints.  Contractions: Not present.   . Movement: Present. Denies leaking of fluid.   The following portions of the patient's history were reviewed and updated as appropriate: allergies, current medications, past family history, past medical history, past social history, past surgical history and problem list.   Objective:   Filed Vitals:   02/25/15 1130  BP: 109/78  Pulse: 100  Temp: 98 F (36.7 C)  Weight: 154 lb 11.2 oz (70.171 kg)    Fetal Status:     Movement: Present     General:  Alert, oriented and cooperative. Patient is in no acute distress.  Skin: Skin is warm and dry. No rash noted.   Cardiovascular: Normal heart rate noted  Respiratory: Normal respiratory effort, no problems with respiration noted  Abdomen: Soft, gravid, appropriate for gestational age. Pain/Pressure: Absent     Pelvic:       Cervical exam deferred        Extremities: Normal range of motion.  Edema: Trace  Mental Status: Normal mood and affect. Normal behavior. Normal judgment and thought content.   Urinalysis: Urine Protein: Negative Urine Glucose: Negative  Assessment and Plan:  Pregnancy: G2P0010 at [redacted]w[redacted]d  There are no diagnoses linked to this encounter. Preterm labor symptoms and general obstetric precautions including but not limited to vaginal bleeding, contractions, leaking of fluid and fetal movement were reviewed in detail with the patient. Please refer to After Visit Summary for other counseling recommendations.  No Follow-up on file.   Rhea Pink, CNM

## 2015-02-25 NOTE — Patient Instructions (Signed)
Third Trimester of Pregnancy The third trimester is from week 29 through week 42, months 7 through 9. The third trimester is a time when the fetus is growing rapidly. At the end of the ninth month, the fetus is about 20 inches in length and weighs 6-10 pounds.  BODY CHANGES Your body goes through many changes during pregnancy. The changes vary from woman to woman.   Your weight will continue to increase. You can expect to gain 25-35 pounds (11-16 kg) by the end of the pregnancy.  You may begin to get stretch marks on your hips, abdomen, and breasts.  You may urinate more often because the fetus is moving lower into your pelvis and pressing on your bladder.  You may develop or continue to have heartburn as a result of your pregnancy.  You may develop constipation because certain hormones are causing the muscles that push waste through your intestines to slow down.  You may develop hemorrhoids or swollen, bulging veins (varicose veins).  You may have pelvic pain because of the weight gain and pregnancy hormones relaxing your joints between the bones in your pelvis. Backaches may result from overexertion of the muscles supporting your posture.  You may have changes in your hair. These can include thickening of your hair, rapid growth, and changes in texture. Some women also have hair loss during or after pregnancy, or hair that feels dry or thin. Your hair will most likely return to normal after your baby is born.  Your breasts will continue to grow and be tender. A yellow discharge may leak from your breasts called colostrum.  Your belly button may stick out.  You may feel short of breath because of your expanding uterus.  You may notice the fetus "dropping," or moving lower in your abdomen.  You may have a bloody mucus discharge. This usually occurs a few days to a week before labor begins.  Your cervix becomes thin and soft (effaced) near your due date. WHAT TO EXPECT AT YOUR PRENATAL  EXAMS  You will have prenatal exams every 2 weeks until week 36. Then, you will have weekly prenatal exams. During a routine prenatal visit:  You will be weighed to make sure you and the fetus are growing normally.  Your blood pressure is taken.  Your abdomen will be measured to track your baby's growth.  The fetal heartbeat will be listened to.  Any test results from the previous visit will be discussed.  You may have a cervical check near your due date to see if you have effaced. At around 36 weeks, your caregiver will check your cervix. At the same time, your caregiver will also perform a test on the secretions of the vaginal tissue. This test is to determine if a type of bacteria, Group B streptococcus, is present. Your caregiver will explain this further. Your caregiver may ask you:  What your birth plan is.  How you are feeling.  If you are feeling the baby move.  If you have had any abnormal symptoms, such as leaking fluid, bleeding, severe headaches, or abdominal cramping.  If you have any questions. Other tests or screenings that may be performed during your third trimester include:  Blood tests that check for low iron levels (anemia).  Fetal testing to check the health, activity level, and growth of the fetus. Testing is done if you have certain medical conditions or if there are problems during the pregnancy. FALSE LABOR You may feel small, irregular contractions that   eventually go away. These are called Braxton Hicks contractions, or false labor. Contractions may last for hours, days, or even weeks before true labor sets in. If contractions come at regular intervals, intensify, or become painful, it is best to be seen by your caregiver.  SIGNS OF LABOR   Menstrual-like cramps.  Contractions that are 5 minutes apart or less.  Contractions that start on the top of the uterus and spread down to the lower abdomen and back.  A sense of increased pelvic pressure or back  pain.  A watery or bloody mucus discharge that comes from the vagina. If you have any of these signs before the 37th week of pregnancy, call your caregiver right away. You need to go to the hospital to get checked immediately. HOME CARE INSTRUCTIONS   Avoid all smoking, herbs, alcohol, and unprescribed drugs. These chemicals affect the formation and growth of the baby.  Follow your caregiver's instructions regarding medicine use. There are medicines that are either safe or unsafe to take during pregnancy.  Exercise only as directed by your caregiver. Experiencing uterine cramps is a good sign to stop exercising.  Continue to eat regular, healthy meals.  Wear a good support bra for breast tenderness.  Do not use hot tubs, steam rooms, or saunas.  Wear your seat belt at all times when driving.  Avoid raw meat, uncooked cheese, cat litter boxes, and soil used by cats. These carry germs that can cause birth defects in the baby.  Take your prenatal vitamins.  Try taking a stool softener (if your caregiver approves) if you develop constipation. Eat more high-fiber foods, such as fresh vegetables or fruit and whole grains. Drink plenty of fluids to keep your urine clear or pale yellow.  Take warm sitz baths to soothe any pain or discomfort caused by hemorrhoids. Use hemorrhoid cream if your caregiver approves.  If you develop varicose veins, wear support hose. Elevate your feet for 15 minutes, 3-4 times a day. Limit salt in your diet.  Avoid heavy lifting, wear low heal shoes, and practice good posture.  Rest a lot with your legs elevated if you have leg cramps or low back pain.  Visit your dentist if you have not gone during your pregnancy. Use a soft toothbrush to brush your teeth and be gentle when you floss.  A sexual relationship may be continued unless your caregiver directs you otherwise.  Do not travel far distances unless it is absolutely necessary and only with the approval  of your caregiver.  Take prenatal classes to understand, practice, and ask questions about the labor and delivery.  Make a trial run to the hospital.  Pack your hospital bag.  Prepare the baby's nursery.  Continue to go to all your prenatal visits as directed by your caregiver. SEEK MEDICAL CARE IF:  You are unsure if you are in labor or if your water has broken.  You have dizziness.  You have mild pelvic cramps, pelvic pressure, or nagging pain in your abdominal area.  You have persistent nausea, vomiting, or diarrhea.  You have a bad smelling vaginal discharge.  You have pain with urination. SEEK IMMEDIATE MEDICAL CARE IF:   You have a fever.  You are leaking fluid from your vagina.  You have spotting or bleeding from your vagina.  You have severe abdominal cramping or pain.  You have rapid weight loss or gain.  You have shortness of breath with chest pain.  You notice sudden or extreme swelling   of your face, hands, ankles, feet, or legs.  You have not felt your baby move in over an hour.  You have severe headaches that do not go away with medicine.  You have vision changes. Document Released: 05/24/2001 Document Revised: 06/04/2013 Document Reviewed: 07/31/2012 Exit[employer] Patient Information 2015 [employer]. This information is not intended to replace advice given to you by your health care provider. Make sure you discuss any questions you have with your health care provider.  

## 2015-03-04 ENCOUNTER — Ambulatory Visit (INDEPENDENT_AMBULATORY_CARE_PROVIDER_SITE_OTHER): Payer: Medicaid Other | Admitting: Advanced Practice Midwife

## 2015-03-04 VITALS — BP 118/77 | HR 94 | Temp 98.8°F | Wt 154.8 lb

## 2015-03-04 DIAGNOSIS — Z3402 Encounter for supervision of normal first pregnancy, second trimester: Secondary | ICD-10-CM | POA: Diagnosis not present

## 2015-03-04 LAB — POCT URINALYSIS DIP (DEVICE)
Bilirubin Urine: NEGATIVE
Glucose, UA: NEGATIVE mg/dL
Ketones, ur: NEGATIVE mg/dL
NITRITE: NEGATIVE
Protein, ur: NEGATIVE mg/dL
SPECIFIC GRAVITY, URINE: 1.015 (ref 1.005–1.030)
Urobilinogen, UA: 0.2 mg/dL (ref 0.0–1.0)
pH: 7.5 (ref 5.0–8.0)

## 2015-03-04 NOTE — Progress Notes (Signed)
Breastfeeding tip of the week reviewed. 

## 2015-03-04 NOTE — Progress Notes (Signed)
Subjective:  Pam Kennedy is a 34 y.o. G2P0010 at [redacted]w[redacted]d being seen today for ongoing prenatal care.  Patient reports occasional contractions.  Contractions: Irregular.  Vag. Bleeding: None. Movement: Present. Denies leaking of fluid.   The following portions of the patient's history were reviewed and updated as appropriate: allergies, current medications, past family history, past medical history, past social history, past surgical history and problem list.   Objective:   Filed Vitals:   03/04/15 1140  BP: 118/77  Pulse: 94  Temp: 98.8 F (37.1 C)  Weight: 154 lb 12.8 oz (70.217 kg)    Fetal Status: Fetal Heart Rate (bpm): 135   Movement: Present     General:  Alert, oriented and cooperative. Patient is in no acute distress.  Skin: Skin is warm and dry. No rash noted.   Cardiovascular: Normal heart rate noted  Respiratory: Normal respiratory effort, no problems with respiration noted  Abdomen: Soft, gravid, appropriate for gestational age. Pain/Pressure: Present     Pelvic: Vag. Bleeding: None     Cervical exam deferred        Extremities: Normal range of motion.  Edema: Trace  Mental Status: Normal mood and affect. Normal behavior. Normal judgment and thought content.   Urinalysis: Urine Protein: Negative Urine Glucose: Negative  Assessment and Plan:  Pregnancy: G2P0010 at [redacted]w[redacted]d  1. Supervision of normal first pregnancy in second trimester   Term labor symptoms and general obstetric precautions including but not limited to vaginal bleeding, contractions, leaking of fluid and fetal movement were reviewed in detail with the patient. Please refer to After Visit Summary for other counseling recommendations.  No Follow-up on file.   Dorathy Kinsman, CNM

## 2015-03-04 NOTE — Patient Instructions (Signed)
Braxton Hicks Contractions °Contractions of the uterus can occur throughout pregnancy. Contractions are not always a sign that you are in labor.  °WHAT ARE BRAXTON HICKS CONTRACTIONS?  °Contractions that occur before labor are called Braxton Hicks contractions, or false labor. Toward the end of pregnancy (32-34 weeks), these contractions can develop more often and may become more forceful. This is not true labor because these contractions do not result in opening (dilatation) and thinning of the cervix. They are sometimes difficult to tell apart from true labor because these contractions can be forceful and people have different pain tolerances. You should not feel embarrassed if you go to the hospital with false labor. Sometimes, the only way to tell if you are in true labor is for your health care provider to look for changes in the cervix. °If there are no prenatal problems or other health problems associated with the pregnancy, it is completely safe to be sent home with false labor and await the onset of true labor. °HOW CAN YOU TELL THE DIFFERENCE BETWEEN TRUE AND FALSE LABOR? °False Labor °· The contractions of false labor are usually shorter and not as hard as those of true labor.   °· The contractions are usually irregular.   °· The contractions are often felt in the front of the lower abdomen and in the groin.   °· The contractions may go away when you walk around or change positions while lying down.   °· The contractions get weaker and are shorter lasting as time goes on.   °· The contractions do not usually become progressively stronger, regular, and closer together as with true labor.   °True Labor °· Contractions in true labor last 30-70 seconds, become very regular, usually become more intense, and increase in frequency.   °· The contractions do not go away with walking.   °· The discomfort is usually felt in the top of the uterus and spreads to the lower abdomen and low back.   °· True labor can be  determined by your health care provider with an exam. This will show that the cervix is dilating and getting thinner.   °WHAT TO REMEMBER °· Keep up with your usual exercises and follow other instructions given by your health care provider.   °· Take medicines as directed by your health care provider.   °· Keep your regular prenatal appointments.   °· Eat and drink lightly if you think you are going into labor.   °· If Braxton Hicks contractions are making you uncomfortable:   °¨ Change your position from lying down or resting to walking, or from walking to resting.   °¨ Sit and rest in a tub of warm water.   °¨ Drink 2-3 glasses of water. Dehydration may cause these contractions.   °¨ Do slow and deep breathing several times an hour.   °WHEN SHOULD I SEEK IMMEDIATE MEDICAL CARE? °Seek immediate medical care if: °· Your contractions become stronger, more regular, and closer together.   °· You have fluid leaking or gushing from your vagina.   °· You have a fever.   °· You pass blood-tinged mucus.   °· You have vaginal bleeding.   °· You have continuous abdominal pain.   °· You have low back pain that you never had before.   °· You feel your baby's head pushing down and causing pelvic pressure.   °· Your baby is not moving as much as it used to.   °Document Released: 05/30/2005 Document Revised: 06/04/2013 Document Reviewed: 03/11/2013 °[employer]® Patient Information ©2015 [employer]. This information is not intended to replace advice given to you by your health care   provider. Make sure you discuss any questions you have with your health care provider. ° °

## 2015-03-11 ENCOUNTER — Ambulatory Visit (INDEPENDENT_AMBULATORY_CARE_PROVIDER_SITE_OTHER): Payer: Medicaid Other | Admitting: Advanced Practice Midwife

## 2015-03-11 VITALS — BP 120/70 | HR 97 | Temp 98.0°F | Wt 157.2 lb

## 2015-03-11 DIAGNOSIS — Z3402 Encounter for supervision of normal first pregnancy, second trimester: Secondary | ICD-10-CM

## 2015-03-11 LAB — POCT URINALYSIS DIP (DEVICE)
Bilirubin Urine: NEGATIVE
Glucose, UA: NEGATIVE mg/dL
Hgb urine dipstick: NEGATIVE
Ketones, ur: NEGATIVE mg/dL
NITRITE: NEGATIVE
Protein, ur: NEGATIVE mg/dL
SPECIFIC GRAVITY, URINE: 1.01 (ref 1.005–1.030)
UROBILINOGEN UA: 0.2 mg/dL (ref 0.0–1.0)
pH: 6.5 (ref 5.0–8.0)

## 2015-03-11 NOTE — Patient Instructions (Signed)

## 2015-03-11 NOTE — Progress Notes (Signed)
Used Interpreter Anegela Tang.Moderate leukocytes noted on urinalysis.

## 2015-03-11 NOTE — Progress Notes (Signed)
Subjective:  Pam Kennedy is a 34 y.o. G2P0010 at [redacted]w[redacted]d being seen today for ongoing prenatal care.  Patient reports backache.  Contractions: Irregular.  Vag. Bleeding: None. Movement: Present. Denies leaking of fluid.   The following portions of the patient's history were reviewed and updated as appropriate: allergies, current medications, past family history, past medical history, past social history, past surgical history and problem list.   Objective:   Filed Vitals:   03/11/15 1020  BP: 120/70  Pulse: 97  Temp: 98 F (36.7 C)  Weight: 157 lb 3.2 oz (71.305 kg)    Fetal Status: Fetal Heart Rate (bpm): 138 Fundal Height: 37 cm Movement: Present  Presentation: Vertex  General:  Alert, oriented and cooperative. Patient is in no acute distress.  Skin: Skin is warm and dry. No rash noted.   Cardiovascular: Normal heart rate noted  Respiratory: Normal respiratory effort, no problems with respiration noted  Abdomen: Soft, gravid, appropriate for gestational age. Pain/Pressure: Present     Pelvic: Vag. Bleeding: None Vag D/C Character: White   Cervical exam deferred        Extremities: Normal range of motion.  Edema: Mild pitting, slight indentation  Mental Status: Normal mood and affect. Normal behavior. Normal judgment and thought content.   Urinalysis: Urine Protein: Negative Urine Glucose: Negative  Assessment and Plan:  Pregnancy: G2P0010 at [redacted]w[redacted]d  There are no diagnoses linked to this encounter. Term labor symptoms and general obstetric precautions including but not limited to vaginal bleeding, contractions, leaking of fluid and fetal movement were reviewed in detail with the patient.  Elevate feet/legs for edema, increase fluid intake, walk daily for improved circulation.  Please refer to After Visit Summary for other counseling recommendations.  Return in about 1 week (around 03/18/2015).   Hurshel Party, CNM

## 2015-03-17 ENCOUNTER — Encounter: Payer: Medicaid Other | Admitting: Obstetrics and Gynecology

## 2015-03-18 ENCOUNTER — Ambulatory Visit (INDEPENDENT_AMBULATORY_CARE_PROVIDER_SITE_OTHER): Payer: Medicaid Other | Admitting: Advanced Practice Midwife

## 2015-03-18 VITALS — BP 128/74 | HR 96 | Temp 98.4°F | Wt 158.3 lb

## 2015-03-18 DIAGNOSIS — Z3402 Encounter for supervision of normal first pregnancy, second trimester: Secondary | ICD-10-CM

## 2015-03-18 LAB — POCT URINALYSIS DIP (DEVICE)
Bilirubin Urine: NEGATIVE
GLUCOSE, UA: NEGATIVE mg/dL
KETONES UR: NEGATIVE mg/dL
NITRITE: NEGATIVE
PROTEIN: NEGATIVE mg/dL
Specific Gravity, Urine: 1.015 (ref 1.005–1.030)
Urobilinogen, UA: 0.2 mg/dL (ref 0.0–1.0)
pH: 7 (ref 5.0–8.0)

## 2015-03-18 NOTE — Patient Instructions (Signed)
Fetal Movement Counts  Patient Name: __________________________________________________ Patient Due Date: ____________________  Performing a fetal movement count is highly recommended in high-risk pregnancies, but it is good for every pregnant woman to do. Your health care provider may ask you to start counting fetal movements at 28 weeks of the pregnancy. Fetal movements often increase:  · After eating a full meal.  · After physical activity.  · After eating or drinking something sweet or cold.  · At rest.  Pay attention to when you feel the baby is most active. This will help you notice a pattern of your baby's sleep and wake cycles and what factors contribute to an increase in fetal movement. It is important to perform a fetal movement count at the same time each day when your baby is normally most active.   HOW TO COUNT FETAL MOVEMENTS  1. Find a quiet and comfortable area to sit or lie down on your left side. Lying on your left side provides the best blood and oxygen circulation to your baby.  2. Write down the day and time on a sheet of paper or in a journal.  3. Start counting kicks, flutters, swishes, rolls, or jabs in a 2-hour period. You should feel at least 10 movements within 2 hours.  4. If you do not feel 10 movements in 2 hours, wait 2-3 hours and count again. Look for a change in the pattern or not enough counts in 2 hours.  SEEK MEDICAL CARE IF:  · You feel less than 10 counts in 2 hours, tried twice.  · There is no movement in over an hour.  · The pattern is changing or taking longer each day to reach 10 counts in 2 hours.  · You feel the baby is not moving as he or she usually does.  Date: ____________ Movements: ____________ Start time: ____________ Finish time: ____________   Date: ____________ Movements: ____________ Start time: ____________ Finish time: ____________  Date: ____________ Movements: ____________ Start time: ____________ Finish time: ____________  Date: ____________ Movements:  ____________ Start time: ____________ Finish time: ____________  Date: ____________ Movements: ____________ Start time: ____________ Finish time: ____________  Date: ____________ Movements: ____________ Start time: ____________ Finish time: ____________  Date: ____________ Movements: ____________ Start time: ____________ Finish time: ____________  Date: ____________ Movements: ____________ Start time: ____________ Finish time: ____________   Date: ____________ Movements: ____________ Start time: ____________ Finish time: ____________  Date: ____________ Movements: ____________ Start time: ____________ Finish time: ____________  Date: ____________ Movements: ____________ Start time: ____________ Finish time: ____________  Date: ____________ Movements: ____________ Start time: ____________ Finish time: ____________  Date: ____________ Movements: ____________ Start time: ____________ Finish time: ____________  Date: ____________ Movements: ____________ Start time: ____________ Finish time: ____________  Date: ____________ Movements: ____________ Start time: ____________ Finish time: ____________   Date: ____________ Movements: ____________ Start time: ____________ Finish time: ____________  Date: ____________ Movements: ____________ Start time: ____________ Finish time: ____________  Date: ____________ Movements: ____________ Start time: ____________ Finish time: ____________  Date: ____________ Movements: ____________ Start time: ____________ Finish time: ____________  Date: ____________ Movements: ____________ Start time: ____________ Finish time: ____________  Date: ____________ Movements: ____________ Start time: ____________ Finish time: ____________  Date: ____________ Movements: ____________ Start time: ____________ Finish time: ____________   Date: ____________ Movements: ____________ Start time: ____________ Finish time: ____________  Date: ____________ Movements: ____________ Start time: ____________ Finish  time: ____________  Date: ____________ Movements: ____________ Start time: ____________ Finish time: ____________  Date: ____________ Movements: ____________ Start time:   ____________ Finish time: ____________  Date: ____________ Movements: ____________ Start time: ____________ Finish time: ____________  Date: ____________ Movements: ____________ Start time: ____________ Finish time: ____________  Date: ____________ Movements: ____________ Start time: ____________ Finish time: ____________   Date: ____________ Movements: ____________ Start time: ____________ Finish time: ____________  Date: ____________ Movements: ____________ Start time: ____________ Finish time: ____________  Date: ____________ Movements: ____________ Start time: ____________ Finish time: ____________  Date: ____________ Movements: ____________ Start time: ____________ Finish time: ____________  Date: ____________ Movements: ____________ Start time: ____________ Finish time: ____________  Date: ____________ Movements: ____________ Start time: ____________ Finish time: ____________  Date: ____________ Movements: ____________ Start time: ____________ Finish time: ____________   Date: ____________ Movements: ____________ Start time: ____________ Finish time: ____________  Date: ____________ Movements: ____________ Start time: ____________ Finish time: ____________  Date: ____________ Movements: ____________ Start time: ____________ Finish time: ____________  Date: ____________ Movements: ____________ Start time: ____________ Finish time: ____________  Date: ____________ Movements: ____________ Start time: ____________ Finish time: ____________  Date: ____________ Movements: ____________ Start time: ____________ Finish time: ____________  Date: ____________ Movements: ____________ Start time: ____________ Finish time: ____________   Date: ____________ Movements: ____________ Start time: ____________ Finish time: ____________  Date: ____________  Movements: ____________ Start time: ____________ Finish time: ____________  Date: ____________ Movements: ____________ Start time: ____________ Finish time: ____________  Date: ____________ Movements: ____________ Start time: ____________ Finish time: ____________  Date: ____________ Movements: ____________ Start time: ____________ Finish time: ____________  Date: ____________ Movements: ____________ Start time: ____________ Finish time: ____________  Date: ____________ Movements: ____________ Start time: ____________ Finish time: ____________   Date: ____________ Movements: ____________ Start time: ____________ Finish time: ____________  Date: ____________ Movements: ____________ Start time: ____________ Finish time: ____________  Date: ____________ Movements: ____________ Start time: ____________ Finish time: ____________  Date: ____________ Movements: ____________ Start time: ____________ Finish time: ____________  Date: ____________ Movements: ____________ Start time: ____________ Finish time: ____________  Date: ____________ Movements: ____________ Start time: ____________ Finish time: ____________     This information is not intended to replace advice given to you by your health care provider. Make sure you discuss any questions you have with your health care provider.     Document Released: 06/29/2006 Document Revised: 06/20/2014 Document Reviewed: 03/26/2012  Elsevier Interactive Patient Education ©2016 [employer]

## 2015-03-18 NOTE — Progress Notes (Signed)
Used Interpreter   Urinalysis shows large leukocytes, trace hemoglobin. Denies symptoms of uti- burning or pain with urination.

## 2015-03-18 NOTE — Progress Notes (Signed)
Subjective:  Pam Kennedy is a 34 y.o. G2P0010 at [redacted]w[redacted]d being seen today for ongoing prenatal care.  Patient reports occasional contractions.  Contractions: Irregular.  Vag. Bleeding: None. Movement: Present. Denies leaking of fluid.   The following portions of the patient's history were reviewed and updated as appropriate: allergies, current medications, past family history, past medical history, past social history, past surgical history and problem list.   Objective:   Filed Vitals:   03/18/15 1024  BP: 128/74  Pulse: 96  Temp: 98.4 F (36.9 C)  Weight: 158 lb 4.8 oz (71.804 kg)    Fetal Status: Fetal Heart Rate (bpm): 145   Movement: Present     General:  Alert, oriented and cooperative. Patient is in no acute distress.  Skin: Skin is warm and dry. No rash noted.   Cardiovascular: Normal heart rate noted  Respiratory: Normal respiratory effort, no problems with respiration noted  Abdomen: Soft, gravid, appropriate for gestational age. Pain/Pressure: Present     Pelvic: Vag. Bleeding: None Vag D/C Character: White   Cervical exam deferred        Extremities: Normal range of motion.     Mental Status: Normal mood and affect. Normal behavior. Normal judgment and thought content.   Urinalysis: Urine Protein: Negative Urine Glucose: Negative  Assessment and Plan:  Pregnancy: G2P0010 at [redacted]w[redacted]d  There are no diagnoses linked to this encounter. Term labor symptoms and general obstetric precautions including but not limited to vaginal bleeding, contractions, leaking of fluid and fetal movement were reviewed in detail with the patient. Please refer to After Visit Summary for other counseling recommendations.  Discussed pain management in labor. Plans epidural  Return in about 1 week (around 03/25/2015) for ROB/NST.   Dorathy Kinsman, CNM

## 2015-03-21 ENCOUNTER — Inpatient Hospital Stay (HOSPITAL_COMMUNITY)
Admission: AD | Admit: 2015-03-21 | Discharge: 2015-03-23 | DRG: 775 | Disposition: A | Discharge status: Home / Self Care | Payer: Medicaid Other | Source: Ambulatory Visit | Attending: Obstetrics & Gynecology | Admitting: Obstetrics & Gynecology

## 2015-03-21 ENCOUNTER — Inpatient Hospital Stay (HOSPITAL_COMMUNITY): Payer: Medicaid Other | Admitting: Anesthesiology

## 2015-03-21 ENCOUNTER — Encounter (HOSPITAL_COMMUNITY): Payer: Self-pay | Admitting: *Deleted

## 2015-03-21 DIAGNOSIS — O480 Post-term pregnancy: Principal | ICD-10-CM | POA: Diagnosis present

## 2015-03-21 DIAGNOSIS — Z3A40 40 weeks gestation of pregnancy: Secondary | ICD-10-CM

## 2015-03-21 DIAGNOSIS — IMO0001 Reserved for inherently not codable concepts without codable children: Secondary | ICD-10-CM

## 2015-03-21 LAB — CBC
HEMATOCRIT: 35.6 % — AB (ref 36.0–46.0)
HEMOGLOBIN: 11.5 g/dL — AB (ref 12.0–15.0)
MCH: 27.4 pg (ref 26.0–34.0)
MCHC: 32.3 g/dL (ref 30.0–36.0)
MCV: 85 fL (ref 78.0–100.0)
Platelets: 231 10*3/uL (ref 150–400)
RBC: 4.19 MIL/uL (ref 3.87–5.11)
RDW: 15.8 % — ABNORMAL HIGH (ref 11.5–15.5)
WBC: 9.4 10*3/uL (ref 4.0–10.5)

## 2015-03-21 LAB — RPR: RPR Ser Ql: NONREACTIVE

## 2015-03-21 LAB — TYPE AND SCREEN
ABO/RH(D): O POS
Antibody Screen: NEGATIVE

## 2015-03-21 MED ORDER — DIBUCAINE 1 % RE OINT: 1.0000 "application " | TOPICAL_OINTMENT | RECTAL | Status: DC | PRN

## 2015-03-21 MED ORDER — LACTATED RINGERS IV SOLN
INTRAVENOUS | Status: DC
  Administered 2015-03-21 (×2): via INTRAVENOUS

## 2015-03-21 MED ORDER — LANOLIN HYDROUS EX OINT: TOPICAL_OINTMENT | CUTANEOUS | Status: DC | PRN

## 2015-03-21 MED ORDER — GENTAMICIN SULFATE 40 MG/ML IJ SOLN
1.5000 mg/kg | Freq: Three times a day (TID) | INTRAVENOUS | Status: DC
  Administered 2015-03-21: 110 mg via INTRAVENOUS
  Filled 2015-03-21 (×2): qty 2.75

## 2015-03-21 MED ORDER — OXYCODONE-ACETAMINOPHEN 5-325 MG PO TABS: 2.0000 | ORAL_TABLET | ORAL | Status: DC | PRN

## 2015-03-21 MED ORDER — FENTANYL CITRATE (PF) 100 MCG/2ML IJ SOLN
INTRAMUSCULAR | Status: AC
  Administered 2015-03-21: 100 ug via INTRAVENOUS
  Filled 2015-03-21: qty 2

## 2015-03-21 MED ORDER — ZOLPIDEM TARTRATE 5 MG PO TABS: 5.0000 mg | ORAL_TABLET | Freq: Every evening | ORAL | Status: DC | PRN

## 2015-03-21 MED ORDER — OXYTOCIN 40 UNITS IN LACTATED RINGERS INFUSION - SIMPLE MED
1.0000 m[IU]/min | INTRAVENOUS | Status: DC
  Administered 2015-03-21: 2 m[IU]/min via INTRAVENOUS

## 2015-03-21 MED ORDER — ONDANSETRON HCL 4 MG/2ML IJ SOLN: 4.0000 mg | INTRAMUSCULAR | Status: DC | PRN

## 2015-03-21 MED ORDER — CITRIC ACID-SODIUM CITRATE 334-500 MG/5ML PO SOLN: 30.0000 mL | ORAL | Status: DC | PRN

## 2015-03-21 MED ORDER — FLEET ENEMA 7-19 GM/118ML RE ENEM: 1.0000 | ENEMA | RECTAL | Status: DC | PRN

## 2015-03-21 MED ORDER — BENZOCAINE-MENTHOL 20-0.5 % EX AERO
1.0000 "application " | INHALATION_SPRAY | CUTANEOUS | Status: DC | PRN
  Filled 2015-03-21: qty 56

## 2015-03-21 MED ORDER — OXYTOCIN 40 UNITS IN LACTATED RINGERS INFUSION - SIMPLE MED
62.5000 mL/h | INTRAVENOUS | Status: DC
  Filled 2015-03-21: qty 1000

## 2015-03-21 MED ORDER — WITCH HAZEL-GLYCERIN EX PADS: 1.0000 "application " | MEDICATED_PAD | CUTANEOUS | Status: DC | PRN

## 2015-03-21 MED ORDER — ACETAMINOPHEN 325 MG PO TABS
650.0000 mg | ORAL_TABLET | Freq: Once | ORAL | Status: AC
  Administered 2015-03-21: 650 mg via ORAL
  Filled 2015-03-21: qty 2

## 2015-03-21 MED ORDER — SIMETHICONE 80 MG PO CHEW: 80.0000 mg | CHEWABLE_TABLET | ORAL | Status: DC | PRN

## 2015-03-21 MED ORDER — OXYCODONE-ACETAMINOPHEN 5-325 MG PO TABS
1.0000 | ORAL_TABLET | ORAL | Status: DC | PRN
  Administered 2015-03-22: 1 via ORAL
  Filled 2015-03-21: qty 1

## 2015-03-21 MED ORDER — FENTANYL CITRATE (PF) 100 MCG/2ML IJ SOLN
100.0000 ug | Freq: Once | INTRAMUSCULAR | Status: AC
  Administered 2015-03-21: 100 ug via INTRAVENOUS

## 2015-03-21 MED ORDER — DIPHENHYDRAMINE HCL 50 MG/ML IJ SOLN: 12.5000 mg | INTRAMUSCULAR | Status: DC | PRN

## 2015-03-21 MED ORDER — VITAMIN K1 1 MG/0.5ML IJ SOLN
INTRAMUSCULAR | Status: AC
  Filled 2015-03-21: qty 0.5

## 2015-03-21 MED ORDER — IBUPROFEN 600 MG PO TABS
600.0000 mg | ORAL_TABLET | Freq: Four times a day (QID) | ORAL | Status: DC
  Administered 2015-03-21 – 2015-03-23 (×7): 600 mg via ORAL
  Filled 2015-03-21 (×7): qty 1

## 2015-03-21 MED ORDER — PHENYLEPHRINE 40 MCG/ML (10ML) SYRINGE FOR IV PUSH (FOR BLOOD PRESSURE SUPPORT)
80.0000 ug | PREFILLED_SYRINGE | INTRAVENOUS | Status: DC | PRN
  Filled 2015-03-21: qty 20

## 2015-03-21 MED ORDER — TERBUTALINE SULFATE 1 MG/ML IJ SOLN: 0.2500 mg | Freq: Once | INTRAMUSCULAR | Status: DC | PRN

## 2015-03-21 MED ORDER — ACETAMINOPHEN 325 MG PO TABS: 650.0000 mg | ORAL_TABLET | ORAL | Status: DC | PRN

## 2015-03-21 MED ORDER — OXYTOCIN BOLUS FROM INFUSION
500.0000 mL | INTRAVENOUS | Status: DC
  Administered 2015-03-21: 500 mL via INTRAVENOUS

## 2015-03-21 MED ORDER — OXYCODONE-ACETAMINOPHEN 5-325 MG PO TABS: 1.0000 | ORAL_TABLET | ORAL | Status: DC | PRN

## 2015-03-21 MED ORDER — LACTATED RINGERS IV SOLN
500.0000 mL | INTRAVENOUS | Status: DC | PRN
  Administered 2015-03-21 (×3): 500 mL via INTRAVENOUS
  Administered 2015-03-21: 1000 mL via INTRAVENOUS

## 2015-03-21 MED ORDER — FENTANYL 2.5 MCG/ML BUPIVACAINE 1/10 % EPIDURAL INFUSION (WH - ANES)
14.0000 mL/h | INTRAMUSCULAR | Status: DC | PRN
  Administered 2015-03-21: 14 mL/h via EPIDURAL
  Filled 2015-03-21 (×2): qty 125

## 2015-03-21 MED ORDER — ONDANSETRON HCL 4 MG PO TABS: 4.0000 mg | ORAL_TABLET | ORAL | Status: DC | PRN

## 2015-03-21 MED ORDER — TETANUS-DIPHTH-ACELL PERTUSSIS 5-2.5-18.5 LF-MCG/0.5 IM SUSP: 0.5000 mL | Freq: Once | INTRAMUSCULAR | Status: DC

## 2015-03-21 MED ORDER — DIPHENHYDRAMINE HCL 25 MG PO CAPS: 25.0000 mg | ORAL_CAPSULE | Freq: Four times a day (QID) | ORAL | Status: DC | PRN

## 2015-03-21 MED ORDER — PRENATAL MULTIVITAMIN CH
1.0000 | ORAL_TABLET | Freq: Every day | ORAL | Status: DC
  Administered 2015-03-22: 1 via ORAL
  Filled 2015-03-21: qty 1

## 2015-03-21 MED ORDER — FENTANYL CITRATE (PF) 100 MCG/2ML IJ SOLN
100.0000 ug | Freq: Once | INTRAMUSCULAR | Status: AC
  Administered 2015-03-21: 100 ug via INTRAVENOUS
  Filled 2015-03-21: qty 2

## 2015-03-21 MED ORDER — SENNOSIDES-DOCUSATE SODIUM 8.6-50 MG PO TABS
2.0000 | ORAL_TABLET | ORAL | Status: DC
  Administered 2015-03-21 – 2015-03-23 (×2): 2 via ORAL
  Filled 2015-03-21 (×2): qty 2

## 2015-03-21 MED ORDER — LIDOCAINE HCL (PF) 1 % IJ SOLN
INTRAMUSCULAR | Status: AC
  Filled 2015-03-21: qty 30

## 2015-03-21 MED ORDER — LACTATED RINGERS IV SOLN
INTRAVENOUS | Status: DC
  Administered 2015-03-21 (×2): via INTRAVENOUS

## 2015-03-21 MED ORDER — EPHEDRINE 5 MG/ML INJ: 10.0000 mg | INTRAVENOUS | Status: DC | PRN

## 2015-03-21 MED ORDER — ONDANSETRON HCL 4 MG/2ML IJ SOLN: 4.0000 mg | Freq: Four times a day (QID) | INTRAMUSCULAR | Status: DC | PRN

## 2015-03-21 MED ORDER — SODIUM CHLORIDE 0.9 % IV SOLN
2.0000 g | Freq: Four times a day (QID) | INTRAVENOUS | Status: DC
  Administered 2015-03-21: 2 g via INTRAVENOUS
  Filled 2015-03-21 (×3): qty 2000

## 2015-03-21 MED ORDER — LIDOCAINE HCL (PF) 1 % IJ SOLN: 30.0000 mL | INTRAMUSCULAR | Status: DC | PRN

## 2015-03-21 NOTE — Progress Notes (Signed)
Pt more relaxed and resting between ctxs.

## 2015-03-21 NOTE — Anesthesia Procedure Notes (Signed)
Epidural Patient location during procedure: OB Start time: 03/21/2015 6:45 AM End time: 03/21/2015 6:49 AM  Preanesthetic Checklist Completed: patient identified, surgical consent, pre-op evaluation, timeout performed, IV checked, risks and benefits discussed and monitors and equipment checked  Epidural Patient position: sitting Prep: site prepped and draped and DuraPrep Patient monitoring: continuous pulse ox and blood pressure Approach: midline Location: L3-L4 Injection technique: LOR air  Needle:  Needle type: Tuohy  Needle gauge: 17 G Needle length: 9 cm and 9 Needle insertion depth: 5 cm cm Catheter type: closed end flexible Catheter size: 19 Gauge Catheter at skin depth: 10 cm Test dose: negative and Other  Assessment Sensory level: T9 Events: blood not aspirated, injection not painful, no injection resistance, negative IV test and no paresthesia  Additional Notes Reason for block:procedure for pain

## 2015-03-21 NOTE — Progress Notes (Signed)
Pam Kennedy is a 34 y.o. G2P0010 at [redacted]w[redacted]d by  admitted for active labor  Subjective: Pt has been pushing for 2 hours and 15 minutes the patient is tired and is resting at this time. Maternal temp is 101 as of 200pm. Fetal tachycardia - 170's  Objective: BP 100/53 mmHg  Pulse 103  Temp(Src) 101 F (38.3 C) (Oral)  Resp 18  Ht  (1.651 m)  Wt 71.668 kg (158 lb)  BMI 26.29 kg/m2  LMP 03/27/2014   Total I/O In: 1000 [I.V.:1000] Out: 600 [Urine:600]  FHT:  Baseline 170 with minimal variability with variable decels UC:   regular, every 2-3 minutes SVE:   Dilation: 10 Effacement (%): 100 Station: +1 Exam by:: Shana Zavaleta (checking pushing effort, stated turn on side, labor down)  Labs: Lab Results  Component Value Date   WBC 9.4 03/21/2015   HGB 11.5* 03/21/2015   HCT 35.6* 03/21/2015   MCV 85.0 03/21/2015   PLT 231 03/21/2015    Assessment / Plan: Chorioamniotis Continue to push for one hour per Dr. Jolayne Panther Requested vacuum assist and Dr. Jolayne Panther would like to have the pt push for one more hour Labor: start antibiotics for chorioamnioitis ampicillin and gentamycin Preeclampsia:   Fetal Wellbeing:  Category II Pain Control:  Epidural I/D:  neg Anticipated MOD:  NSVD  Mette Southgate Grissett 03/21/2015, 2:20 PM

## 2015-03-21 NOTE — Progress Notes (Signed)
Report called to Dr Wende Mott. Aware of sve change. Will admit to BS.

## 2015-03-21 NOTE — MAU Note (Signed)
Contractions since 0100. Denies LOF and no bleeding

## 2015-03-21 NOTE — Anesthesia Preprocedure Evaluation (Signed)

## 2015-03-21 NOTE — Progress Notes (Signed)
Dan Europe RN in [employer] called and given report. Pt to 165 via w/c

## 2015-03-21 NOTE — Progress Notes (Signed)
Dr Wende Mott on unit and in to see pt. Will admit to BS. EFM reviewed.

## 2015-03-21 NOTE — H&P (Signed)
Chief Complaint:  Contractions  HPI: Pam Kennedy is a 34 y.o. G2P0010 at [redacted]w[redacted]d who presents to maternity admissions reporting contractions and discomfort. Patient reports contractions began earlier tonight, waking her up. Denies leakage of fluid or vaginal bleeding. Good fetal movement.   Patient was watched in the MAU to assess for cervical change. Change was noted as well as regular contractions. FOB at bedside.  Pregnancy Course:  Paternal age 72> to MFM for first tri screen and counseling > declined   Clinic  [facility] Prenatal Labs  Dating  6wk [place] Blood type: O/POS/-- (04/05 1200)   Genetic Screen 1 Screen:    AFP:     Quad:Neg   NIPS: Antibody:NEG (04/05 1200)  Anatomic [place]  20 wk; marginal cord insertion and r pylectasis > rescan 28 wks >> Resolved Rubella: 11.50 (04/05 1200)  GTT Early:               Third trimester: 120 RPR: NON REAC (04/05 1200)   Flu vaccine  given 02/18/2015 HBsAg: NEGATIVE (04/05 1200)   TDaP vaccine     01/07/15                                          Rhogam:NA HIV: NONREACTIVE (04/05 1200)   GBS  Neg                                            (For PCN allergy, check sensitivities) GBS: Neg  Contraception  Pap: Negative April 2016  Baby Food  Breast   Circumcision Female   Pediatrician  [facility]   Support Person  [facility] ([facility])     Past Medical History: Past Medical History  Diagnosis Date  . Medical history non-contributory     Past obstetric history: OB History  Gravida Para Term Preterm AB SAB TAB Ectopic Multiple Living  # Outcome Date GA Lbr Len/2nd Weight Sex Delivery Anes PTL Lv  2 Current           1 SAB 2008              Past Surgical History: Past Surgical History  Procedure Laterality Date  . No past surgeries       Family History: History reviewed. No pertinent family history.  Social History: Social History  Substance Use Topics  . Smoking status: Never Smoker   . Smokeless tobacco: Never Used  .  Alcohol Use: No    Allergies: No Known Allergies  Meds:  Prescriptions prior to admission  Medication Sig Dispense Refill Last Dose  . Prenat-FeFum-FePo-FA-Omega 3 (CONCEPT DHA) 53.5-38-1 MG CAPS Take 1 tablet by mouth daily. 30 capsule 2 03/20/2015 at Unknown time  . vitamin E 400 UNIT capsule Take 400 Units by mouth daily.   03/20/2015 at Unknown time    ROS: Pertinent findings in history of present illness.  Physical Exam  Blood pressure 115/76, pulse 92, temperature 98.2 F (36.8 C), resp. rate 20, height  (1.651 m), weight 71.668 kg (158 lb), last menstrual period 03/27/2014. GENERAL: Well-developed, well-nourished female in no acute distress, but obvious discomfort. HEENT: normocephalic HEART: normal rate RESP: normal effort ABDOMEN: Soft, non-tender, gravid appropriate for  gestational age EXTREMITIES: Nontender, no edema NEURO: alert and oriented Dilation: 4.5 Effacement (%): 100 Cervical Position: Anterior Station: -1 Presentation: Vertex Exam by:: Quintella Baton RNC  FHT:  Baseline 135 , moderate variability, accelerations present, no decelerations Contractions: q 2-3 mins   Labs: No results found for this or any previous visit (from the past 24 hour(s)).  Imaging:  No results found.  Assessment: 1. Labor: SOL, no ROM at this time 2. Fetal Wellbeing: Category 1  3. Pain Control: patient would like epidural 4. GBS: neg 5. 40.1 week IUP  Plan:  1. Admit to BS per consult with MD 2. Routine L&D orders 3. Analgesia/anesthesia PRN       Medication List    ASK your doctor about these medications        CONCEPT DHA 53.5-38-1 MG Caps  Take 1 tablet by mouth daily.     vitamin E 400 UNIT capsule  Take 400 Units by mouth daily.        Kathee Delton, MD 03/21/2015 5:31 AM

## 2015-03-22 NOTE — Anesthesia Postprocedure Evaluation (Signed)
  Anesthesia Post-op Note  Patient: Pam Kennedy  Procedure(s) Performed: * No procedures listed *  Patient Location: Mother/Baby  Anesthesia Type:Epidural  Level of Consciousness: awake, alert , oriented and patient cooperative  Airway and Oxygen Therapy: Patient Spontanous Breathing  Post-op Pain: none  Post-op Assessment: Post-op Vital signs reviewed, Patient's Cardiovascular Status Stable, Respiratory Function Stable, Patent Airway, No headache, No backache and Patient able to bend at knees              Post-op Vital Signs: Reviewed and stable  Last Vitals:  Filed Vitals:   03/21/15 2240  BP: 101/61  Pulse: 93  Temp: 36.9 C  Resp: 18    Complications: No apparent anesthesia complications

## 2015-03-22 NOTE — Anesthesia Postprocedure Evaluation (Signed)
Anesthesia Post Note  Patient: Pam Kennedy  Procedure(s) Performed: * No procedures listed *  Anesthesia type: Epidural  Patient location: Mother/Baby  Post pain: Pain level controlled  Post assessment: Post-op Vital signs reviewed  Last Vitals:  Filed Vitals:   03/21/15 2240  BP: 101/61  Pulse: 93  Temp: 36.9 C  Resp: 18    Post vital signs: Reviewed  Level of consciousness: awake  Complications: No apparent anesthesia complications

## 2015-03-22 NOTE — Progress Notes (Signed)
Post Partum Day 1  Subjective: no complaints, up ad lib, voiding, tolerating PO and + flatus  Objective: Blood pressure 105/59, pulse 108, temperature 97.8 F (36.6 C), temperature source Oral, resp. rate 18, height  (1.651 m), weight 158 lb (71.668 kg), last menstrual period 03/27/2014, SpO2 99 %, unknown if currently breastfeeding.  Physical Exam:  General: alert, cooperative and no distress Lochia: appropriate Uterine Fundus: firm Incision: n/a DVT Evaluation: No evidence of DVT seen on physical exam. Negative Homan's sign.   Recent Labs  03/21/15 0420  HGB 11.5*  HCT 35.6*    Assessment/Plan: Plan for discharge tomorrow, Breastfeeding and Lactation consult   Nurse education on care of the newborn imperative for this couple before discharge.   LOS: 1 day   Pam Kennedy H. 03/22/2015, 8:53 AM

## 2015-03-22 NOTE — Addendum Note (Signed)
Addendum  created 03/22/15 1610 by Yolonda Kida, CRNA   Modules edited: Charges VN, Notes Section   Notes Section:  File: [id number]

## 2015-03-22 NOTE — Lactation Note (Signed)
This note was copied from the chart of Pam Ailani [place]. Lactation Consultation Note  Patient Name: Pam Kennedy [id number] Date: 03/22/2015 Reason for consult: Follow-up assessment;Breast/nipple pain   Mom called for feeding assistance. Infant undressed and placed STS for feeding. Assisted mom with [employer] and football holds. Assisted mom with positioning infant using pillows prior to feeding. Infant latched after encouraging her to open mouth wide, taught mom to assess and flange lips as needed, infant did require flanging with some latches. Infant with vigorous sucking bursts and drowsy spells, encouraged mom to use awakening techniques. Mom noted that she feels pinching at times with feeding, infant did tend to pinch nipple which was noted after latch. The tips of moms nipples are reddened, no breakdown noted at this time.  Mom reports it did feel better at times also. Infant noted to have intermittent swallows.  BF basics, STS, lip flanging, NB feeding behavior, awakening techniques, frequency of feeds, I/O discussed. Mom was still concerned her milk is making the baby spit, she doesn't have enough milk and her baby will be cold without several blankets, again reviewed infant stomach size, nutritional needs of NB, STS for temperature regulation and to aid in milk production. Told mom to call with questions/concerns. Comfort gels given with explanation for use.   Maternal Data Formula Feeding for Exclusion: No Does the patient have breastfeeding experience prior to this delivery?: No  Feeding Feeding Type: Breast Fed Length of feed: 15 min  LATCH Score/Interventions Latch: Grasps breast easily, tongue down, lips flanged, rhythmical sucking. Intervention(s): Adjust position;Assist with latch;Breast massage;Breast compression  Audible Swallowing: A few with stimulation  Type of Nipple: Everted at rest and after stimulation  Comfort (Breast/Nipple): Filling, red/small blisters or bruises,  mild/mod discomfort  Problem noted: Cracked, bleeding, blisters, bruises;Mild/Moderate discomfort Interventions  (Cracked/bleeding/bruising/blister): Expressed breast milk to nipple Interventions (Mild/moderate discomfort): Hand massage;Hand expression;Comfort gels  Hold (Positioning): Assistance needed to correctly position infant at breast and maintain latch. Intervention(s): Breastfeeding basics reviewed;Support Pillows;Position options;Skin to skin  LATCH Score: 7  Lactation Tools Discussed/Used     Consult Status Consult Status: Follow-up Date: 03/23/15 Follow-up type: In-patient    Silas Flood Hice 03/22/2015, 2:15 PM

## 2015-03-22 NOTE — Lactation Note (Signed)
This note was copied from the chart of Pam Kennedy. Lactation Consultation Note  Patient Name: Pam Davonda Ausley [id number] Date: 03/22/2015 Reason for consult: Initial assessment   Initial Consult with 18 hour old infant born to G1 mom. Baby has had 8 BF, 1 attempt, 1 stool, 0 voids, and 2 % weight loss since birth. OB discussed with me earlier that mom has sore and excoriated nipples and asked me to see her. Mom is concerned she does not have enough milk and the reason for her sore nipples and that her milk has caused the infant to have emesis.Discussed infant stomach size, nutritional needs and how sore nipples generally occur due to latch.  Discussed BF Basics  NL NB behavior, supply and demand, cluster feeding, I/O, sore nipples and Proper latch. Infant was asleep and mom reported she just fed at 10:30. Left phone # and asked mom to call me for next feeding for feeding assessment. Will give Comfort gels at that time. Discussed feeding cues and showed chart. Enc mom to feed 8-12 x in 24 hours at first feeding cues. Will follow up for feeding later. Referred to BF information in Taking Care of Baby and Me Booklet, mom says she cannot read [place] but knows someone who can. [facility] Brochure given, informed mom of LC Phone #, Support Groups, OP services and BF Resources.    Maternal Data Formula Feeding for Exclusion: No Does the patient have breastfeeding experience prior to this delivery?: No  Feeding Feeding Type: Breast Fed Length of feed: 10 min  LATCH Score/Interventions Latch: Repeated attempts needed to sustain latch, nipple held in mouth throughout feeding, stimulation needed to elicit sucking reflex. Intervention(s): Adjust position;Assist with latch;Breast massage;Breast compression  Audible Swallowing: A few with stimulation Intervention(s): Skin to skin;Hand expression  Type of Nipple: Everted at rest and after stimulation  Comfort (Breast/Nipple): Soft / non-tender     Hold  (Positioning): Assistance needed to correctly position infant at breast and maintain latch.  LATCH Score: 7  Lactation Tools Discussed/Used     Consult Status Consult Status: Follow-up Date: 03/22/15 Follow-up type: In-patient    [name] 03/22/2015, 11:09 AM

## 2015-03-23 MED ORDER — IBUPROFEN 600 MG PO TABS: 600.0000 mg | ORAL_TABLET | Freq: Four times a day (QID) | ORAL | Status: DC

## 2015-03-23 NOTE — Lactation Note (Signed)
This note was copied from the chart of Pam Kennedy. Lactation Consultation Note  Patient Name: Pam Kennedy [id number] Date: 03/23/2015 Reason for consult: Initial assessment Baby 42 hours old. Mom reports that baby is nursing well. Mom reports that side-lying position is working well. Discussed using football and cross-cradle as well. Mom aware of OP/BFSG and LC phone line assistance.  Maternal Data Has patient been taught Hand Expression?: Yes (Per mom.)  Feeding Feeding Type: Breast Fed Length of feed: 10 min  LATCH Score/Interventions                      Lactation Tools Discussed/Used     Consult Status Consult Status: Complete    [name] 03/23/2015, 10:04 AM

## 2015-03-23 NOTE — Discharge Summary (Signed)
OB Discharge Summary  Patient Name: Pam Kennedy DOB: 1980-10-05 MRN: [id number]  Date of admission: 03/21/2015 Delivering MD: Illene Bolus A   Date of discharge: 03/23/2015  Admitting diagnosis: 40wks, contractions Intrauterine pregnancy: [redacted]w[redacted]d     Secondary diagnosis: None     Discharge diagnosis: Term Pregnancy Delivered                                                                                                Post partum procedures:none  Augmentation: Pitocin  Complications: None  Hospital course:  Onset of Labor With Vaginal Delivery     34 y.o. yo G2P1011 at [redacted]w[redacted]d was admitted in Active Laboron 03/21/2015. Patient had an uncomplicated labor course as follows:  Membrane Rupture Time/Date: 10:20 AM ,03/21/2015   Intrapartum Procedures: Episiotomy: None [1]                                         Lacerations:  2nd degree [3]  Patient had a delivery of a Viable infant. 03/21/2015  Information for the patient's newborn:  Roya, Gieselman [[id number]]  Delivery Method: Vaginal, Spontaneous Delivery (Filed from Delivery Summary)    Pateint had an uncomplicated postpartum course.  She is ambulating, tolerating a regular diet, passing flatus, and urinating well. Patient is discharged home in stable condition on No discharge date for patient encounter.Marland Kitchen    Physical exam  Filed Vitals:   03/21/15 1840 03/21/15 2240 03/22/15 0612 03/22/15 1707  BP: 91/50 101/61 105/59 93/55  Pulse: 112 93 108 91  Temp: 99.2 F (37.3 C) 98.4 F (36.9 C) 97.8 F (36.6 C) 98.4 F (36.9 C)  TempSrc: Oral Oral Oral Oral  Resp: Height:      Weight:      SpO2: 97% 99%     General: alert, cooperative and no distress Lochia: appropriate Uterine Fundus: firm  Incision: Healing well with no significant drainage DVT Evaluation: No evidence of DVT seen on physical exam. Negative Homan's sign. No cords or calf tenderness. Labs: Lab Results  Component Value Date   WBC 9.4  03/21/2015   HGB 11.5* 03/21/2015   HCT 35.6* 03/21/2015   MCV 85.0 03/21/2015   PLT 231 03/21/2015   No flowsheet data found.  Discharge instruction: per After Visit Summary and "Baby and Me Booklet".  Medications:  Current facility-administered medications:  .  acetaminophen (TYLENOL) tablet 650 mg, 650 mg, Oral, Q4H PRN, Casey Burkitt, MD .  benzocaine-Menthol (DERMOPLAST) 20-0.5 % topical spray 1 application, 1 application, Topical, PRN, Casey Burkitt, MD .  witch hazel-glycerin (TUCKS) pad 1 application, 1 application, Topical, PRN **AND** dibucaine (NUPERCAINAL) 1 % rectal ointment 1 application, 1 application, Rectal, PRN, Casey Burkitt, MD .  diphenhydrAMINE (BENADRYL) capsule 25 mg, 25 mg, Oral, Q6H PRN, Casey Burkitt, MD .  ibuprofen (ADVIL,MOTRIN) tablet 600 mg, 600 mg, Oral, 4 times per day, Casey Burkitt, MD, 600 mg at 03/23/15 0047 .  lanolin ointment, , Topical, PRN, Casey Burkitt, MD .  lidocaine (PF) (XYLOCAINE) 1 % injection 30 mL, 30 mL, Subcutaneous, PRN, Kathee Delton, MD .  ondansetron [facility]) tablet 4 mg, 4 mg, Oral, Q4H PRN **OR** ondansetron (ZOFRAN) injection 4 mg, 4 mg, Intravenous, Q4H PRN, Casey Burkitt, MD .  oxyCODONE-acetaminophen (PERCOCET/ROXICET) 5-325 MG per tablet 1 tablet, 1 tablet, Oral, Q4H PRN, Casey Burkitt, MD, 1 tablet at 03/22/15 1019 .  oxyCODONE-acetaminophen (PERCOCET/ROXICET) 5-325 MG per tablet 2 tablet, 2 tablet, Oral, Q4H PRN, Casey Burkitt, MD .  prenatal multivitamin tablet 1 tablet, 1 tablet, Oral, Q1200, Casey Burkitt, MD, 1 tablet at 03/22/15 1244 .  senna-docusate (Senokot-S) tablet 2 tablet, 2 tablet, Oral, Q24H, Casey Burkitt, MD, 2 tablet at 03/23/15 0048 .  simethicone (MYLICON) chewable tablet 80 mg, 80 mg, Oral, PRN, Casey Burkitt, MD .  Tdap (BOOSTRIX) injection 0.5 mL, 0.5 mL, Intramuscular, Once, Casey Burkitt, MD, 0.5 mL at 03/22/15 1000 .  zolpidem (AMBIEN) tablet 5 mg, 5 mg, Oral, QHS PRN, Casey Burkitt, MD  Diet: routine diet  Activity: Advance as tolerated. Pelvic rest for 6 weeks.   Outpatient follow up:6 weeks  Postpartum contraception: Undecided  Newborn Data: Live born female  Birth Weight: 7 lb 14.3 oz (3580 g) APGAR: 7, 9  Baby Feeding: Breast Disposition:home with mother   03/23/2015 Ferdie Ping, CNM

## 2015-03-24 NOTE — H&P (Signed)
Chief Complaint:  Contractions  HPI: Pam Kennedy is a 34 y.o. G2P0010 at [redacted]w[redacted]d who presents to maternity admissions reporting contractions and discomfort. Patient reports contractions began earlier tonight, waking her up. Denies leakage of fluid or vaginal bleeding. Good fetal movement.   Patient was watched in the MAU to assess for cervical change. Change was noted as well as regular contractions. FOB at bedside.  Pregnancy Course:  Paternal age 22> to MFM for first tri screen and counseling > declined   Clinic  [facility] Prenatal Labs  Dating  6wk [place] Blood type: O/POS/-- (04/05 1200)   Genetic Screen 1 Screen:    AFP:     Quad:Neg   NIPS: Antibody:NEG (04/05 1200)  Anatomic [place]  20 wk; marginal cord insertion and r pylectasis > rescan 28 wks >> Resolved Rubella: 11.50 (04/05 1200)  GTT Early:               Third trimester: 120 RPR: NON REAC (04/05 1200)   Flu vaccine  given 02/18/2015 HBsAg: NEGATIVE (04/05 1200)   TDaP vaccine     01/07/15                                          Rhogam:NA HIV: NONREACTIVE (04/05 1200)   GBS  Neg                                            (For PCN allergy, check sensitivities) GBS: Neg  Contraception  Pap: Negative April 2016  Baby Food  Breast   Circumcision Female   Pediatrician  [facility]   Support Person  Robert ([facility])     Past Medical History: Past Medical History  Diagnosis Date  . Medical history non-contributory     Past obstetric history: OB History  Gravida Para Term Preterm AB SAB TAB Ectopic Multiple Living  0 1    # Outcome Date GA Lbr Len/2nd Weight Sex Delivery Anes PTL Lv  2 Term 03/21/15 [redacted]w[redacted]d 07:42 / 07:18 7 lb 14.3 oz (3.58 kg) F Vag-Spont EPI  Y  1 SAB 2008              Past Surgical History: Past Surgical History  Procedure Laterality Date  . No past surgeries       Family History: History reviewed. No pertinent family history.  Social History: Social History  Substance Use Topics  . Smoking  status: Never Smoker   . Smokeless tobacco: Never Used  . Alcohol Use: No    Allergies: No Known Allergies  Meds:  No prescriptions prior to admission    ROS: Pertinent findings in history of present illness.  Physical Exam  Blood pressure 110/71, pulse 96, temperature 98.4 F (36.9 C), temperature source Oral, resp. rate 18, height  (1.651 m), weight 158 lb (71.668 kg), last menstrual period 03/27/2014, SpO2 99 %, unknown if currently breastfeeding. GENERAL: Well-developed, well-nourished female in no acute distress, but obvious discomfort. HEENT: normocephalic HEART: normal rate RESP: normal effort ABDOMEN: Soft, non-tender, gravid appropriate for gestational age EXTREMITIES: Nontender, no edema NEURO: alert and oriented Dilation: 4.5 Effacement (%): 100 Cervical Position: Anterior Station: -1 Presentation: Vertex Exam by:: Quintella Baton, RN  FHT:  Baseline 135 , moderate variability, accelerations present, no decelerations Contractions: q 2-3 mins   Labs: No results found for this or any previous visit (from the past 24 hour(s)).  Imaging:  No results found.  Assessment: 1. Labor: SOL, no ROM at this time 2. Fetal Wellbeing: Category 1  3. Pain Control: patient would like epidural 4. GBS: neg 5. 40.1 week IUP  Plan:  1. Admit to BS per consult with MD 2. Routine L&D orders 3. Analgesia/anesthesia PRN       Medication List    TAKE these medications        CONCEPT DHA 53.5-38-1 MG Caps  Take 1 tablet by mouth daily.     ibuprofen 600 MG tablet  Commonly known as:  ADVIL,MOTRIN  Take 1 tablet (600 mg total) by mouth every 6 (six) hours.     vitamin E 400 UNIT capsule  Take 400 Units by mouth daily.        [place], CNM 03/24/2015 9:51 AM

## 2015-03-25 ENCOUNTER — Encounter: Payer: Medicaid Other | Admitting: Family

## 2015-03-28 ENCOUNTER — Inpatient Hospital Stay (HOSPITAL_COMMUNITY): Admission: RE | Admit: 2015-03-28 | Payer: Medicaid Other | Source: Ambulatory Visit

## 2015-03-30 ENCOUNTER — Ambulatory Visit (INDEPENDENT_AMBULATORY_CARE_PROVIDER_SITE_OTHER): Payer: Medicaid Other | Admitting: Obstetrics and Gynecology

## 2015-03-30 ENCOUNTER — Encounter: Payer: Self-pay | Admitting: Obstetrics and Gynecology

## 2015-03-30 VITALS — BP 110/80 | HR 89 | Temp 98.7°F | Wt 144.0 lb

## 2015-03-30 DIAGNOSIS — L03315 Cellulitis of perineum: Secondary | ICD-10-CM

## 2015-03-30 MED ORDER — POLYETHYLENE GLYCOL 3350 17 GM/SCOOP PO POWD: ORAL | Status: DC

## 2015-03-30 MED ORDER — CLINDAMYCIN HCL 300 MG PO CAPS: 300.0000 mg | ORAL_CAPSULE | Freq: Four times a day (QID) | ORAL | Status: DC

## 2015-03-30 NOTE — Progress Notes (Signed)
CLINIC ENCOUNTER NOTE  History:  34 y.o. G2P1011 here today for vaginal bulging.   Delivered at term on 10/8. 2nd degree laceration, repaired. L/d complicated by chorio treated w/ abx, no fevers or other symtpoms postpartum.  Concerned about possible hemorrhoid. Painful bulging. Thinks something might be coming out of vagina as well. Moderately painful. Mild vaginal bleeding. Not using any treatments. Is constipated, is straining to stool. Not using stool softener. No fever or nausea/vomiting.  Past Medical History  Diagnosis Date  . Medical history non-contributory     Past Surgical History  Procedure Laterality Date  . No past surgeries      The following portions of the patient's history were reviewed and updated as appropriate: allergies, current medications, past family history, past medical history, past social history, past surgical history and problem list.    Review of Systems:  See above; comprehensive review of systems was otherwise negative.  Objective:  Physical Exam BP 110/80 mmHg  Pulse 89  Temp(Src) 98.7 F (37.1 C)  Wt 144 lb (65.318 kg)  Breastfeeding? Yes CONSTITUTIONAL: Well-developed, well-nourished female in no acute distress.  HENT:  Normocephalic, atraumatic SKIN: Skin is warm and dry.  NEUROLGIC: Alert  PSYCHIATRIC: Normal mood and affect.  CARDIOVASCULAR: Normal heart rate noted RESPIRATORY: Effort and breath sounds normal, no problems with respiration noted ABDOMEN: Soft, no distention noted.  No tenderness, rebound or guarding.  PELVIC: healing second degree repaired laceration. Right side of perineal laceration there is a 1 cm area that is indurated and tender. No fluctuance or drainage. Vaginal exam reveals no defect.   Labs and Imaging No results found.  Assessment & Plan:   # Cellulitis - at site of perineal repair. No apparent abscess. No constitutional signs/symptoms. Do not think this hemorrohoids. - clindamycin 300 mg po q6 hours  for 7 days - f/u here 3 days - sitz baths 2-3 times daily - infection return precautions  # Constipation - miralax     Pretty Weltman B. Brit Wernette, MD OB/GYN Fellow [facility]

## 2015-04-02 ENCOUNTER — Encounter: Payer: Self-pay | Admitting: Obstetrics and Gynecology

## 2015-04-02 ENCOUNTER — Ambulatory Visit (INDEPENDENT_AMBULATORY_CARE_PROVIDER_SITE_OTHER): Payer: Medicaid Other | Admitting: Obstetrics and Gynecology

## 2015-04-02 VITALS — BP 104/69 | HR 90 | Resp 18 | Ht 65.0 in | Wt 144.5 lb

## 2015-04-02 DIAGNOSIS — Z5189 Encounter for other specified aftercare: Secondary | ICD-10-CM

## 2015-04-02 NOTE — Progress Notes (Signed)
Patient ID: Pam Kennedy, female   DOB: 06/13/1980, [age] y.o.   MRN: [id number] Patient presents today for the evaluation of her perineum. Patient is s/p SVD on 03/21/2015 complicated by cellulitis at the site of second degree repair. Patient reports feeling well and is without complaints.   Pelvic exam: Normal perineum without evidence of erythema or induration. Slight tender to touch. Stiches still visible  A/P 34 yo s/p SVD on 10/8 here for vulva examination - Perineum healing well - Complete antibiotics as prescribed - RTC in 4 weeks for pp visit

## 2015-04-10 ENCOUNTER — Encounter (HOSPITAL_COMMUNITY): Payer: Self-pay | Admitting: *Deleted

## 2015-04-10 ENCOUNTER — Inpatient Hospital Stay (HOSPITAL_COMMUNITY)
Admission: AD | Admit: 2015-04-10 | Discharge: 2015-04-10 | Disposition: A | Discharge status: Home / Self Care | Payer: Medicaid Other | Source: Ambulatory Visit | Attending: Obstetrics and Gynecology | Admitting: Obstetrics and Gynecology

## 2015-04-10 DIAGNOSIS — O9122 Nonpurulent mastitis associated with the puerperium: Secondary | ICD-10-CM | POA: Diagnosis not present

## 2015-04-10 DIAGNOSIS — N644 Mastodynia: Secondary | ICD-10-CM | POA: Diagnosis present

## 2015-04-10 LAB — URINALYSIS, ROUTINE W REFLEX MICROSCOPIC
BILIRUBIN URINE: NEGATIVE
Glucose, UA: NEGATIVE mg/dL
Ketones, ur: NEGATIVE mg/dL
NITRITE: NEGATIVE
PROTEIN: NEGATIVE mg/dL
Specific Gravity, Urine: <1.005 — ABNORMAL LOW (ref 1.005–1.030)
Urobilinogen, UA: 0.2 mg/dL (ref 0.0–1.0)
pH: 5.5 (ref 5.0–8.0)

## 2015-04-10 LAB — URINE MICROSCOPIC-ADD ON

## 2015-04-10 MED ORDER — IBUPROFEN 400 MG PO TABS: 400.0000 mg | ORAL_TABLET | Freq: Four times a day (QID) | ORAL | Status: DC | PRN

## 2015-04-10 MED ORDER — CEPHALEXIN 500 MG PO CAPS: 500.0000 mg | ORAL_CAPSULE | Freq: Four times a day (QID) | ORAL | Status: DC

## 2015-04-10 NOTE — MAU Note (Signed)
Pt called to triage, but is breastfeeding in Radiology waiting room.

## 2015-04-10 NOTE — MAU Note (Signed)
Pt had vaginal delivery on 10/8, pt had temp of 101.4 last night, pt states nipples are very sore.  Had vaginal infection previously, finished antibiotics.  Pt has taken ibuprofen today.

## 2015-04-10 NOTE — MAU Provider Note (Signed)
History     CSN: [id number]  Arrival date and time: 04/10/15 1605   First Provider Initiated Contact with Patient 04/10/15 1836      Chief Complaint  Patient presents with  . Fever  . Breast Pain   This is a 34 y.o. female who is about 20 days postpartum who presents with c/o breast pain bilaterally and fever for the past 2 days. Just finished antibiotics for a perineal cellulits. States that is better. Denies any abdominal pain or unusual bleeding.    Other This is a new problem. The current episode started in the past 7 days. The problem occurs constantly. The problem has been unchanged. Associated symptoms include a fever. Pertinent negatives include no abdominal pain, change in bowel habit, chills, fatigue, myalgias, nausea, vomiting or weakness. Exacerbated by: palpation or feeding. She has tried NSAIDs for the symptoms. The treatment provided mild relief.   RN Note: Pt had vaginal delivery on 10/8, pt had temp of 101.4 last night, pt states nipples are very sore. Had vaginal infection previously, finished antibiotics. Pt has taken ibuprofen today.         OB History    Gravida Para Term Preterm AB TAB SAB Ectopic Multiple Living   2 1 1  1  1   0 1      Past Medical History  Diagnosis Date  . Medical history non-contributory     Past Surgical History  Procedure Laterality Date  . No past surgeries      History reviewed. No pertinent family history.  Social History  Substance Use Topics  . Smoking status: Never Smoker   . Smokeless tobacco: Never Used  . Alcohol Use: No    Allergies: No Known Allergies  Prescriptions prior to admission  Medication Sig Dispense Refill Last Dose  . clindamycin (CLEOCIN) 300 MG capsule Take 1 capsule (300 mg total) by mouth 4 (four) times daily. 28 capsule 0 Taking  . ibuprofen (ADVIL,MOTRIN) 600 MG tablet Take 1 tablet (600 mg total) by mouth every 6 (six) hours. 30 tablet 0 Taking  . polyethylene glycol powder  (GLYCOLAX/MIRALAX) powder One capful mixed with liquid once daily (Patient not taking: Reported on 04/02/2015) 255 g 6 Not Taking  . Prenat-FeFum-FePo-FA-Omega 3 (CONCEPT DHA) 53.5-38-1 MG CAPS Take 1 tablet by mouth daily. 30 capsule 2 Taking  . vitamin E 400 UNIT capsule Take 400 Units by mouth daily.   Taking    Review of Systems  Constitutional: Positive for fever. Negative for chills, malaise/fatigue and fatigue.  Respiratory: Negative for shortness of breath.   Cardiovascular:       Bilateral breast redness and tenderness  Gastrointestinal: Negative for nausea, vomiting, abdominal pain, diarrhea, constipation and change in bowel habit.  Genitourinary: Negative for dysuria.  Musculoskeletal: Negative for myalgias and back pain.  Neurological: Negative for weakness.   Physical Exam   Blood pressure 106/74, pulse 91, temperature 97.9 F (36.6 C), temperature source Oral, resp. rate 18, currently breastfeeding.  Physical Exam  Constitutional: She is oriented to person, place, and time. She appears well-developed and well-nourished. No distress.  HENT:  Head: Normocephalic.  Cardiovascular: Normal rate and regular rhythm.   Respiratory: Effort normal. No respiratory distress.    Areas of erethema   GI: Soft. She exhibits no distension. There is no tenderness. There is no rebound and no guarding.  Musculoskeletal: Normal range of motion.  Neurological: She is alert and oriented to person, place, and time.  Skin: Skin is warm  and dry.  Psychiatric: She has a normal mood and affect.    MAU Course  Procedures  MDM Discussed mastitis and breastfeeding. Instructed to keep feeding regularly.   Assessment and Plan  A:  Postpartum mastitis  P:  Discussed need for different antibiotic       Rx Keflex x 10 days       Rx ibuprofen for fever and pain       Call clinic Monday if not at least somewhat better       Come back here if worsens         [facility] 04/10/2015,  6:36 PM

## 2015-04-10 NOTE — Discharge Instructions (Signed)
Breastfeeding and Mastitis Mastitis is inflammation of the breast tissue. It can occur in women who are breastfeeding. This can make breastfeeding painful. Mastitis will sometimes go away on its own. Your health care provider will help determine if treatment is needed. CAUSES Mastitis is often associated with a blocked milk (lactiferous) duct. This can happen when too much milk builds up in the breast. Causes of excess milk in the breast can include:  Poor latch-on. If your baby is not latched onto the breast properly, she or he may not empty your breast completely while breastfeeding.  Allowing too much time to pass between feedings.  Wearing a bra or other clothing that is too tight. This puts extra pressure on the lactiferous ducts so milk does not flow through them as it should. Mastitis can also be caused by a bacterial infection. Bacteria may enter the breast tissue through cuts or openings in the skin. In women who are breastfeeding, this may occur because of cracked or irritated skin. Cracks in the skin are often caused when your baby does not latch on properly to the breast. SIGNS AND SYMPTOMS  Swelling, redness, tenderness, and pain in an area of the breast.  Swelling of the glands under the arm on the same side.  Fever may or may not accompany mastitis. If an infection is allowed to progress, a collection of pus (abscess) may develop. DIAGNOSIS  Your health care provider can usually diagnose mastitis based on your symptoms and a physical exam. Tests may be done to help confirm the diagnosis. These may include:  Removal of pus from the breast by applying pressure to the area. This pus can be examined in the lab to determine which bacteria are present. If an abscess has developed, the fluid in the abscess can be removed with a needle. This can also be used to confirm the diagnosis and determine the bacteria present. In most cases, pus will not be present.  Blood tests to determine if  your body is fighting a bacterial infection.  Mammogram or ultrasound tests to rule out other problems or diseases. TREATMENT  Mastitis that occurs with breastfeeding will sometimes go away on its own. Your health care provider may choose to wait 24 hours after first seeing you to decide whether a prescription medicine is needed. If your symptoms are worse after 24 hours, your health care provider will likely prescribe an antibiotic medicine to treat the mastitis. He or she will determine which bacteria are most likely causing the infection and will then select an appropriate antibiotic medicine. This is sometimes changed based on the results of tests performed to identify the bacteria, or if there is no response to the antibiotic medicine selected. Antibiotic medicines are usually given by mouth. You may also be given medicine for pain. HOME CARE INSTRUCTIONS  Only take over-the-counter or prescription medicines for pain, fever, or discomfort as directed by your health care provider.  If your health care provider prescribed an antibiotic medicine, take the medicine as directed. Make sure you finish it even if you start to feel better.  Do not wear a tight or underwire bra. Wear a soft, supportive bra.  Increase your fluid intake, especially if you have a fever.  Continue to empty the breast. Your health care provider can tell you whether this milk is safe for your infant or needs to be thrown out. You may be told to stop nursing until your health care provider thinks it is safe for your baby.   Use a breast pump if you are advised to stop nursing.  Keep your nipples clean and dry.  Empty the first breast completely before going to the other breast. If your baby is not emptying your breasts completely for some reason, use a breast pump to empty your breasts.  If you go back to work, pump your breasts while at work to stay in time with your nursing schedule.  Avoid allowing your breasts to become  overly filled with milk (engorged). SEEK MEDICAL CARE IF:  You have pus-like discharge from the breast.  Your symptoms do not improve with the treatment prescribed by your health care provider within 2 days. SEEK IMMEDIATE MEDICAL CARE IF:  Your pain and swelling are getting worse.  You have pain that is not controlled with medicine.  You have a red line extending from the breast toward your armpit.  You have a fever or persistent symptoms for more than 2-3 days.  You have a fever and your symptoms suddenly get worse. MAKE SURE YOU:   Understand these instructions.  Will watch your condition.  Will get help right away if you are not doing well or get worse.   This information is not intended to replace advice given to you by your health care provider. Make sure you discuss any questions you have with your health care provider.   Document Released: 09/24/2004 Document Revised: 06/04/2013 Document Reviewed: 01/03/2013 [employer] Interactive Patient Education 2016 [employer]  

## 2015-04-20 ENCOUNTER — Other Ambulatory Visit: Payer: Self-pay | Admitting: Advanced Practice Midwife

## 2015-04-28 ENCOUNTER — Ambulatory Visit (INDEPENDENT_AMBULATORY_CARE_PROVIDER_SITE_OTHER): Payer: Medicaid Other | Admitting: Certified Nurse Midwife

## 2015-04-28 ENCOUNTER — Encounter: Payer: Self-pay | Admitting: Certified Nurse Midwife

## 2015-04-28 VITALS — BP 105/63 | HR 96 | Temp 98.7°F | Ht 60.0 in | Wt 139.4 lb

## 2015-04-28 DIAGNOSIS — N61 Mastitis without abscess: Secondary | ICD-10-CM

## 2015-04-28 MED ORDER — CEPHALEXIN 500 MG PO CAPS: 500.0000 mg | ORAL_CAPSULE | Freq: Four times a day (QID) | ORAL | Status: AC

## 2015-04-28 NOTE — Patient Instructions (Signed)
Breastfeeding and Mastitis Mastitis is inflammation of the breast tissue. It can occur in women who are breastfeeding. This can make breastfeeding painful. Mastitis will sometimes go away on its own. Your health care provider will help determine if treatment is needed. CAUSES Mastitis is often associated with a blocked milk (lactiferous) duct. This can happen when too much milk builds up in the breast. Causes of excess milk in the breast can include:  Poor latch-on. If your baby is not latched onto the breast properly, she or he may not empty your breast completely while breastfeeding.  Allowing too much time to pass between feedings.  Wearing a bra or other clothing that is too tight. This puts extra pressure on the lactiferous ducts so milk does not flow through them as it should. Mastitis can also be caused by a bacterial infection. Bacteria may enter the breast tissue through cuts or openings in the skin. In women who are breastfeeding, this may occur because of cracked or irritated skin. Cracks in the skin are often caused when your baby does not latch on properly to the breast. SIGNS AND SYMPTOMS  Swelling, redness, tenderness, and pain in an area of the breast.  Swelling of the glands under the arm on the same side.  Fever may or may not accompany mastitis. If an infection is allowed to progress, a collection of pus (abscess) may develop. DIAGNOSIS  Your health care provider can usually diagnose mastitis based on your symptoms and a physical exam. Tests may be done to help confirm the diagnosis. These may include:  Removal of pus from the breast by applying pressure to the area. This pus can be examined in the lab to determine which bacteria are present. If an abscess has developed, the fluid in the abscess can be removed with a needle. This can also be used to confirm the diagnosis and determine the bacteria present. In most cases, pus will not be present.  Blood tests to determine if  your body is fighting a bacterial infection.  Mammogram or ultrasound tests to rule out other problems or diseases. TREATMENT  Mastitis that occurs with breastfeeding will sometimes go away on its own. Your health care provider may choose to wait 24 hours after first seeing you to decide whether a prescription medicine is needed. If your symptoms are worse after 24 hours, your health care provider will likely prescribe an antibiotic medicine to treat the mastitis. He or she will determine which bacteria are most likely causing the infection and will then select an appropriate antibiotic medicine. This is sometimes changed based on the results of tests performed to identify the bacteria, or if there is no response to the antibiotic medicine selected. Antibiotic medicines are usually given by mouth. You may also be given medicine for pain. HOME CARE INSTRUCTIONS  Only take over-the-counter or prescription medicines for pain, fever, or discomfort as directed by your health care provider.  If your health care provider prescribed an antibiotic medicine, take the medicine as directed. Make sure you finish it even if you start to feel better.  Do not wear a tight or underwire bra. Wear a soft, supportive bra.  Increase your fluid intake, especially if you have a fever.  Continue to empty the breast. Your health care provider can tell you whether this milk is safe for your infant or needs to be thrown out. You may be told to stop nursing until your health care provider thinks it is safe for your baby.   Use a breast pump if you are advised to stop nursing.  Keep your nipples clean and dry.  Empty the first breast completely before going to the other breast. If your baby is not emptying your breasts completely for some reason, use a breast pump to empty your breasts.  If you go back to work, pump your breasts while at work to stay in time with your nursing schedule.  Avoid allowing your breasts to become  overly filled with milk (engorged). SEEK MEDICAL CARE IF:  You have pus-like discharge from the breast.  Your symptoms do not improve with the treatment prescribed by your health care provider within 2 days. SEEK IMMEDIATE MEDICAL CARE IF:  Your pain and swelling are getting worse.  You have pain that is not controlled with medicine.  You have a red line extending from the breast toward your armpit.  You have a fever or persistent symptoms for more than 2-3 days.  You have a fever and your symptoms suddenly get worse. MAKE SURE YOU:   Understand these instructions.  Will watch your condition.  Will get help right away if you are not doing well or get worse.   This information is not intended to replace advice given to you by your health care provider. Make sure you discuss any questions you have with your health care provider.   Document Released: 09/24/2004 Document Revised: 06/04/2013 Document Reviewed: 01/03/2013 Elsevier Interactive Patient Education 2016 Elsevier Inc.  

## 2015-04-28 NOTE — Progress Notes (Signed)
Language Resources Claudius Pam Kennedy  Pt reports having discomfort in breast; continuously applying warm cloth.  Can not breast feed on right side due to pain

## 2015-04-28 NOTE — Progress Notes (Signed)
POSTPARTUM EXAM SUMMARY  SUBJECTIVE: Pt is having redness and engorged area in right breast; and not as much redness and engorgement on the left breast. Does not complain of vaginal tear other feeling some sharp pains occasionally. Appears to be healing well Patient Active Problem List   Diagnosis Date Noted  . Mastitis without abscess 04/28/2015  . Active labor 03/21/2015  . 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 Supervision of normal first pregnancy in second trimester 09/16/2014  . Constipation during pregnancy in second trimester 09/16/2014   Current Outpatient Prescriptions  Medication Sig Dispense Refill  . ibuprofen (ADVIL,MOTRIN) 600 MG tablet Take 1 tablet (600 mg total) by mouth every 6 (six) hours. 30 tablet 0  . Prenat-FeFum-FePo-FA-Omega 3 (CONCEPT DHA) 53.5-38-1 MG CAPS Take 1 tablet by mouth daily. 30 capsule 2  . cephALEXin (KEFLEX) 500 MG capsule Take 1 capsule (500 mg total) by mouth 4 (four) times daily. 40 capsule 0  . clindamycin (CLEOCIN) 300 MG capsule Take 1 capsule (300 mg total) by mouth 4 (four) times daily. (Patient not taking: Reported on 04/28/2015) 28 capsule 0  . ibuprofen (ADVIL,MOTRIN) 400 MG tablet Take 1 tablet (400 mg total) by mouth every 6 (six) hours as needed. (Patient not taking: Reported on 04/28/2015) 30 tablet 0  . polyethylene glycol powder (GLYCOLAX/MIRALAX) powder One capful mixed with liquid once daily (Patient not taking: Reported on 04/02/2015) 255 g 6  . vitamin E 400 UNIT capsule Take 400 Units by mouth daily.     No current facility-administered medications for this visit.   Past Medical History  Diagnosis Date  . Medical history non-contributory    Past Surgical History  Procedure Laterality Date  . No past surgeries     No family history on file. Social History  Substance Use Topics  . Smoking status: Never Smoker   . Smokeless tobacco: Never Used  . Alcohol Use: No   Social History   Social History Narrative    [name] is a  34 y.o. who is now 4 weeks postpartum. Postpartum Mastitis. OB History    Gravida Para Term Preterm AB TAB SAB Ectopic Multiple Living   2 1 1  1  1   0 1     Method of delivery: normal spontaneous vaginal delivery She is breast-feeding and is experiencing problems. Pregnancy complications: none and none. She is feeling happy. She currently uses no method for contraception. She plans to use no method for contraception.  OBJECTIVE: Date of last Pap smear: 2016 Physical Exam: GENERAL APPEARANCE: alert, well appearing, in no apparent distress LUNGS: clear to auscultation, no wheezes, rales or rhonchi, symmetric air entry HEART: regular rate and rhythm, no murmurs ABDOMEN POSTPARTUM: benign non-tender, without masses or organomegaly palpable EXTREMITIES: no redness or tenderness in the calves or thighs, no edema SKIN: normal coloration and turgor, no rashes, no suspicious skin lesions noted NEUROLOGIC: alert, oriented, normal speech, no focal findings or movement disorder noted VAGINA POSTPARTUM: normal, well-healed, physiologic discharge, without lesions  ASSESSMENT: normal postpartum exam PLAN:  See orders and Patient Instructions Contraceptive counseling for no method Lactation Consult Keflex for Mastitis x 10 days

## 2015-05-01 ENCOUNTER — Ambulatory Visit: Payer: Medicaid Other | Admitting: Obstetrics & Gynecology

## 2015-05-05 ENCOUNTER — Other Ambulatory Visit: Payer: Self-pay | Admitting: Medical

## 2015-05-05 ENCOUNTER — Ambulatory Visit
Admission: RE | Admit: 2015-05-05 | Discharge: 2015-05-05 | Disposition: A | Discharge status: Home / Self Care | Payer: Medicaid Other | Source: Ambulatory Visit | Attending: Medical | Admitting: Medical

## 2015-05-05 ENCOUNTER — Inpatient Hospital Stay (HOSPITAL_COMMUNITY)
Admission: AD | Admit: 2015-05-05 | Discharge: 2015-05-05 | Disposition: A | Discharge status: Home / Self Care | Payer: Medicaid Other | Source: Ambulatory Visit | Attending: Obstetrics & Gynecology | Admitting: Obstetrics & Gynecology

## 2015-05-05 ENCOUNTER — Ambulatory Visit (HOSPITAL_COMMUNITY)
Admission: RE | Admit: 2015-05-05 | Discharge: 2015-05-05 | Disposition: A | Discharge status: Home / Self Care | Payer: Medicaid Other | Source: Ambulatory Visit | Attending: Obstetrics and Gynecology | Admitting: Obstetrics and Gynecology

## 2015-05-05 DIAGNOSIS — N611 Abscess of the breast and nipple: Secondary | ICD-10-CM

## 2015-05-05 DIAGNOSIS — O9112 Abscess of breast associated with the puerperium: Secondary | ICD-10-CM | POA: Diagnosis not present

## 2015-05-05 DIAGNOSIS — N63 Unspecified lump in unspecified breast: Secondary | ICD-10-CM

## 2015-05-05 NOTE — Discharge Instructions (Signed)
Abscess °An abscess (boil or furuncle) is an infected area on or under the skin. This area is filled with yellowish-white fluid (pus) and other material (debris). °HOME CARE  °· Only take medicines as told by your doctor. °· If you were given antibiotic medicine, take it as directed. Finish the medicine even if you start to feel better. °· If gauze is used, follow your doctor's directions for changing the gauze. °· To avoid spreading the infection: °¨ Keep your abscess covered with a bandage. °¨ Wash your hands well. °¨ Do not share personal care items, towels, or whirlpools with others. °¨ Avoid skin contact with others. °· Keep your skin and clothes clean around the abscess. °· Keep all doctor visits as told. °GET HELP RIGHT AWAY IF:  °· You have more pain, puffiness (swelling), or redness in the wound site. °· You have more fluid or blood coming from the wound site. °· You have muscle aches, chills, or you feel sick. °· You have a fever. °MAKE SURE YOU:  °· Understand these instructions. °· Will watch your condition. °· Will get help right away if you are not doing well or get worse. °  °This information is not intended to replace advice given to you by your health care provider. Make sure you discuss any questions you have with your health care provider. °  °Document Released: 11/16/2007 Document Revised: 11/29/2011 Document Reviewed: 08/13/2011 °Elsevier Interactive Patient Education ©2016 Elsevier Inc. ° °

## 2015-05-05 NOTE — MAU Provider Note (Signed)
History     CSN: [id number]  Arrival date and time: 05/05/15 1205   First Provider Initiated Contact with Patient 05/05/15 1341      Chief Complaint  Patient presents with  . Breast Pain   HPI Ms. Phineas SemenYan Hua Manville is a 34 y.o. G2P1011 who delivered on 03/21/15 presents to [facility]U today with complaint of breast pain and swelling. The patient has been treated for mastitis with 3 different antibiotics. She states that she is currently on Keflex. She is also taking Ibuprofen for pain. She denies fever. She is currently breastfeeding.   OB History    Gravida Para Term Preterm AB TAB SAB Ectopic Multiple Living   2 1 1  1  1   0 1      Past Medical History  Diagnosis Date  . Medical history non-contributory     Past Surgical History  Procedure Laterality Date  . No past surgeries      No family history on file.  Social History  Substance Use Topics  . Smoking status: Never Smoker   . Smokeless tobacco: Never Used  . Alcohol Use: No    Allergies: No Known Allergies  Prescriptions prior to admission  Medication Sig Dispense Refill Last Dose  . cephALEXin (KEFLEX) 500 MG capsule Take 1 capsule (500 mg total) by mouth 4 (four) times daily. 40 capsule 0 05/05/2015 at Unknown time  . ibuprofen (ADVIL,MOTRIN) 600 MG tablet Take 1 tablet (600 mg total) by mouth every 6 (six) hours. 30 tablet 0 05/05/2015 at Unknown time  . Prenat-FeFum-FePo-FA-Omega 3 (CONCEPT DHA) 53.5-38-1 MG CAPS Take 1 tablet by mouth daily. 30 capsule 2 05/05/2015 at Unknown time  . clindamycin (CLEOCIN) 300 MG capsule Take 1 capsule (300 mg total) by mouth 4 (four) times daily. (Patient not taking: Reported on 04/28/2015) 28 capsule 0 Not Taking  . ibuprofen (ADVIL,MOTRIN) 400 MG tablet Take 1 tablet (400 mg total) by mouth every 6 (six) hours as needed. (Patient not taking: Reported on 04/28/2015) 30 tablet 0 Not Taking  . polyethylene glycol powder (GLYCOLAX/MIRALAX) powder One capful mixed with liquid once daily  (Patient not taking: Reported on 04/02/2015) 255 g 6 Not Taking  . vitamin E 400 UNIT capsule Take 400 Units by mouth daily.   Not Taking    Review of Systems  Constitutional: Negative for fever and malaise/fatigue.  Genitourinary:       + breast pain, swelling   Physical Exam   Blood pressure 97/66, pulse 65, temperature 97.4 F (36.3 C), temperature source Oral, resp. rate 16, currently breastfeeding.  Physical Exam  Nursing note and vitals reviewed. Constitutional: She is oriented to person, place, and time. She appears well-developed and well-nourished. No distress.  HENT:  Head: Normocephalic and atraumatic.  Cardiovascular: Normal rate.   Respiratory: Effort normal. Right breast exhibits skin change (large area of induration with significant erythema noted at 6:00) and tenderness. Right breast exhibits no inverted nipple, no mass and no nipple discharge.  GI: Soft. She exhibits no distension.  Neurological: She is alert and oriented to person, place, and time.  Skin: Skin is warm and dry. No erythema.  Psychiatric: She has a normal mood and affect.   MAU Course  Procedures None  MDM Discussed patient with Dr. Macon LargeAnyanwu. She will come and assess the patient's need for US and/or I&D.  Dr. Macon LargeAnyanwu agrees that the patient has developed an abscess of the right breast. [facility] and  [facility].  Assessment and Plan  A: Postpartum Breast abscess, right  P: Discharge home Continue Ibuprofen PRN for pain Continue Keflex until otherwise instructed by the provider at [facility] Warning signs for worsening condition discussed Patient advised to follow-up with [facility] today immediately after leaving [facility] Patient may return to MAU as needed or if her condition were to change or worsen   Marny Lowenstein, PA-C  05/05/2015, 1:41 PM

## 2015-05-05 NOTE — Lactation Note (Signed)
Lactation Consult  Mother's reason for visit:  Mom having recurrent Mastitis Visit Type:  Outpatient Appointment Notes:  Mom here for feeding assessment and evaluation of blocked milk ducts on right breast. Mom is now on her 3rd treatment of antibiotics for mastitis in the right breast. She is c/o of breast/nipple pain on the right breast, has been BF only on the left breast due to pain and reports yellow mucous discharge from right nipple so she was afraid to nurse on the right breast.  Baby Misty Stanley is now 70 weeks old. Mom is currently taking Keflex 500 mg QID last started on 04/28/15.  Consult:  Initial Lactation Consultant:  Alfred Levins  ________________________________________________________________________   Joan Flores Name: Maurine Minister Rorrer Date of Birth: 03/21/2014 Pediatrician: [facility] Gender: female Gestational Age: [redacted]w[redacted]d (At Birth) Birth Weight: 7 lb 14.3 oz (3580 g) Weight at Discharge: Weight: 7 lb 6.5 oz (3360 g)Date of Discharge: 03/23/2015 [facility] Weights   03/21/15 1600 03/22/15 0010 03/22/15 2335  Weight: 7 lb 14.3 oz (3580 g) 7 lb 12.3 oz (3525 g) 7 lb 6.5 oz (3360 g)  Last Weight check 04/27/15  9 lb. 8.0 oz Weight today: 10 lb. 7.0 oz. 4734 gm      ________________________________________________________________________  Mother's Name: Claris Gower Grimley Type of delivery:  SVB Breastfeeding Experience:  P1 Maternal Medical Conditions:  None reported Maternal Medications:  Keflex 500 mg QID, Motrin prn  ________________________________________________________________________  Breastfeeding History (Post Discharge)  Frequency of breastfeeding:  Currently BF on left breast only 6-7 times/day Duration of feeding:  30-60 minutes  Supplementation  Formula:  Enfamil - started 2 days ago 60 ml 3 times/day    Method:  Bottle   Mom has been pumping 2-3 times/day using either Harmony Hand Pump or EvenFlo DEBP receiving 30-60  ml  Infant Intake and Output Assessment  Voids:  7 in 24 hrs.  Color:  Clear yellow Stools:  1-2 in 24 hrs.  Color:  Yellow  ________________________________________________________________________  Maternal Breast Assessment:  Right breast is full/engorged. From 4-7 o'clock on the lower area of breast is dark red, almost purple, firm, painful to the touch. Redness radiates around nipple aerola. Nodules palpable in outer quadrant but no redness in this area. Nipple/aerola is swollen/engorged red. No d/c noted today.   Left breast if full becoming engorged. Small nodule (pea-sized) palpable at 1:00, no redness observed, slight tender to touch.  Baby hungry but could not latch to right breast due to fullness. Hand expressed left breast and Mom latched baby in cradle hold. Baby not obtaining good depth. Assisted Mom with positioning for baby to obtain depth. Baby latched deeply initially but would become shallow while nursing requiring re-latch. Chewing motions noted with the feeding, some pulling on/off the breast. On suck exam on LC finger baby chews and some tongue restriction with mobility noted.  Baby does tuck the upper lip while at the breast requiring adjustment. On exam, Baby is noted to have thick short labial frenulum. Red crease across upper lip after nursing due to tension from tight frenulum with un-tucking the upper lip. Baby is noted to have short posterior lingual frenulum with restricted upward and lateral movement of her tongue with suck exam.  No evidence of thrush today.   Baby nursed for 20 minutes on left breast and transferred 32 ml. Breast soft after nursing.   LC assisted Mom to pre-pump the right breast for baby to latch. Baby again had difficulty sustaining depth  at the breast, pulling on/off, chewing noted intermittently. Demonstrated massaging of right breast especially area of redness/firmness to help empty breast well.  Baby nursed for 15 minutes and transferred 26  ml. Breast softened with nursing except the reddened area. This area softer but did not resolve completely. Mom reported some discomfort with massage but tolerated well.  Mom then used Symphony DEBP to post pump to empty breasts better.   Total amount pumped post feed:  R 43 ml    L 16 ml  Total amount transferred:  58 ml Total supplement given:  42 ml of EBM  Based on observation of feeding today and baby's oral assessment. LC feels baby is not transferring milk well at Mom's breast which has led to persistent mastitis. LC feels this is due to restriction caused by labial/lingual frenulum. Mom has also not been pumping as frequently as needed to treat mastitis and not using a good breast pump to help her.  After 3 treatment of antibiotics starting 04/02/15 her mastitis should be resolved. Her last course of antibiotics was started 04/28/15 1 week ago today with no improvement in symptoms. Called [facility]U and talked to NP Judeth HornErin Lawrence and she advised Mom coming to [facility]U for evaluation and possible referral to Breast [facility]. Advised Mom rent DEBP to use while treating mastitis to better empty her breast. 2 week pump rental completed. Feeding Plan given to Mom: Breast needs to be emptied every 2-3 hours; Apply warm compresses to breast for 20 minutes before breastfeeding Pre-pump/massage breasts for 3-5 minutes before latching baby to soften nipple/aerola Baby to nurse for 15-30 minutes both breasts, 15-20 minutes on average, every 2-3 hours. Post pump both breasts using Symphony DEBP for 15-20 minutes till soft. Apply ice packs after pumping. Give baby back EBM you pump - at least 30-45 ml with each feeding. Take antibiotics as prescribed. Take Ibuprofen as prescribed. Start Probiotics - refrigerated. Take every day as directed on bottle. OP f/u with lactation Wednesday, 05/13/15 at 10:30. Information given to parents regarding "tongue-tie", frenotomy to consider.  Interpreter - Alfonzo BeersEdith Chu from  [employer] present for visit. Mom understands and can speak English, FOB is American.

## 2015-05-05 NOTE — MAU Note (Addendum)
Sent up from lactation, ? Mastitis ? Blocked duct rt breast.  Lump in breast.  Had fever last wk.  Del 10/8

## 2015-05-08 LAB — CULTURE, ROUTINE-ABSCESS: SPECIAL REQUESTS: NORMAL

## 2015-05-11 ENCOUNTER — Telehealth: Payer: Self-pay | Admitting: General Practice

## 2015-05-11 NOTE — Telephone Encounter (Signed)
Patient's husband called on patient's behalf stating she is experiencing a very bad cold and is currently breastfeeding and wants to know what she can take. Called patient and husband answered. Told him patient can take any cold medicine but should avoid sudafed or pseudoephedrine. Patient's husband verbalized understanding and had no other questions

## 2015-05-12 ENCOUNTER — Other Ambulatory Visit: Payer: Self-pay | Admitting: Medical

## 2015-05-12 DIAGNOSIS — N611 Abscess of the breast and nipple: Secondary | ICD-10-CM

## 2015-05-13 ENCOUNTER — Encounter: Payer: Self-pay | Admitting: *Deleted

## 2015-05-13 ENCOUNTER — Ambulatory Visit (HOSPITAL_COMMUNITY)
Admission: RE | Admit: 2015-05-13 | Discharge: 2015-05-13 | Disposition: A | Discharge status: Home / Self Care | Payer: Medicaid Other | Source: Ambulatory Visit | Attending: Obstetrics and Gynecology | Admitting: Obstetrics and Gynecology

## 2015-05-13 NOTE — Lactation Note (Signed)
Lactation Consult for Pam Kennedy (DOB: [birth year])  Consult:  Follow-Up from last week Lactation Consultant:  Remigio Eisenmengerichey, Lydia Toren Hamilton  ________________________________________________________________________ BW: 7# 14.3oz Wt on 05-05-15: 10# 7 oz Today's weight: 11# 11.3oz ________________________________________________________________________  Mother's Name: Pam Kennedy Type of delivery:   Breastfeeding Experience: primip Maternal Medical Conditions:  None Maternal Medications: pseudoephedrine *(see note below) ________________________________________________________________________  Breastfeeding History (Post Discharge)  Frequency of breastfeeding: q2-3h Duration of feeding: 20 min-1 hr  Pumping  Type of pump: Currently renting [employer] Frequency:  8 times/day   Volume: around 40 ml  Receives a bottle q3h (3-4oz). Has been doing this for 2 weeks.  Infant Intake and Output Assessment  Voids:  in 24 hrs.  Color:  Clear yellow Stools:  in 24 hrs.  Color:  Yellow  ________________________________________________________________________  Maternal Breast Assessment  Breast:  Full Nipple:  Erect  _______________________________________________________________________ Feeding Assessment/Evaluation  Initial feeding assessment:  Infant's oral assessment:  WNL  Positioning:  Laid back   Lips flanged:  Yes.     Suck assessment:  Displays both  Pre-feed weight:  5310g  Post-feed weight: 5326 g  Amount transferred: 16 ml R breast, cross-cradle  Pre-feed weight: 5326 g   Post-feed weight: 5330 g  Amount transferred: 4 ml R breast, cross-cradle w/NS & prefilling NS w/small amount of EBM  Pre-feed weight: 5330 g  Post-feed weight: 5370 g  Amount transferred: 40 ml Amount supplemented: 45ml of EBM L breast, laid-back position  Total amount transferred: 60 ml Total supplement given: 45 ml of EBM  Pam Kennedy is not efficient at the breast. Since  she has received heavy supplementation via bottle over the last couple of weeks, a nipple shield was applied and prefilled w/EBM to see if that would improve her ability at the breast, which it did not. Pam fed the best when her mother was placed in a laid-back position and the breast was sandwiched towards baby's face. Pam Kennedy was able to transfer 40mL from the mother's L breast. I feel that Pam's tongue mobility was WNL. I do not suspect tongue restriction.   Pam Kennedy has had a 20 oz weight gain over the last 8 days. Per parents, she receives a bottle of 3-4 oz of EBM after every feeding at the breast. If Pam Kennedy is transferring around 2 oz from every breastfeeding & then receiving a 3-4 oz bottle of EBM, she is being fed 40-48 oz of milk/day. Parents told that intake at this time would be more like 26-27oz/day (which would increase as baby's weight increased).  Dad reports that he had been telling the mother that the baby was being overfed. Dad reports + spitting up after feeds. I showed Dad how to do paced bottle-feeding during the consult. Instead of taking 3-4 oz from the bottle, she took only 1.5oz and then showed signs of satiety.   Mom's breasts: Mom had an I&D of the abscess on her R breast 8 days ago. The site looks good & there is nothing other than duct tissue palpable in that area. Mom is returning to the breast clinic in 2 days for a f/u ultrasound. Mom was instructed to feed baby/pump prior to US to increase visibility of non-ductal tissue.  Mom has a concern about a pea-sized bump at the 5 o'clock mark on her R breast. Mom encouraged to mention this to them on Friday. Mom has concern about other lumps, but I only palpated ductal tissue (it may be possible that Mom  is more aware of her breast tissue than others, but she was encouraged to share any concerns with the breast clinic).    Mom has had a cold & she was taking pseudoephedrine (her last dose was over 12 hrs ago). Parents were instructed that  Mom was to no longer take pseudoephedrine b/c of its effect on milk supply. Phenylephrine (which does not show an effect on milk supply) was recommended instead.   Plan: 1. Try feeding baby in a laid-back position, sandwiching breast towards baby's face. Add massage when baby acts as if milk flow is slow. The website, www.biologicalnurturing.com was recommended for additional pictures of laid-back nursing. 2. Do paced-bottle feeding when offering a bottle (being careful not to overfeed baby). 3. Pump as you have been. You can add hand expression at the end of pumping to yield more EBM (hand expression was taught to Mom).  4. Turn down temp in house. Parents report that the house temp is around 77-78 degrees b/c Mom is always cold. Mom encouraged to wear more clothes, so that Pam Stanley would not become overheated. (Dad also reports that he had been telling Mom that the house was too hot).   Per interpreter, Mom arrived for her appt w/concerns and now she is leaving feeling relieved. Mom and her interpreter report that she learned a lot.   Glenetta Hew, RN, IBCLC

## 2015-05-15 ENCOUNTER — Encounter: Payer: Self-pay | Admitting: Obstetrics and Gynecology

## 2015-05-15 ENCOUNTER — Ambulatory Visit (INDEPENDENT_AMBULATORY_CARE_PROVIDER_SITE_OTHER): Payer: Medicaid Other | Admitting: Obstetrics and Gynecology

## 2015-05-15 VITALS — BP 109/70 | HR 76 | Temp 98.3°F | Wt 137.4 lb

## 2015-05-15 DIAGNOSIS — N611 Abscess of the breast and nipple: Secondary | ICD-10-CM

## 2015-05-15 NOTE — Progress Notes (Signed)
Patient ID: Pam Kennedy, female   DOB: 02/22/1981, 34 y.o.   MRN: [id number] 34 yo G2P1011 presenting today as a 2 week follow up on right breast abscess. Patient was seen by breast center on 05/05/2015 where an I&D of right breast abscess was performed. Patient reports feeling much better since the procedure. She denies any breast tenderness or pain. She continues to breast feed. Patient is not sexually active  Past Medical History  Diagnosis Date  . Medical history non-contributory    Past Surgical History  Procedure Laterality Date  . No past surgeries     No family history on file. Social History  Substance Use Topics  . Smoking status: Never Smoker   . Smokeless tobacco: Never Used  . Alcohol Use: No   ROS See pertinent in HPI  Blood pressure 109/70, pulse 76, temperature 98.3 F (36.8 C), weight 137 lb 6.4 oz (62.324 kg), currently breastfeeding. GENERAL: Well-developed, well-nourished female in no acute distress.  BREASTS: Symmetric in size. No palpable masses or lymphadenopathy, skin changes, or nipple drainage. ABDOMEN: Soft, nontender, nondistended. No organomegaly. EXTREMITIES: No cyanosis, clubbing, or edema, 2+ distal pulses.  A/P 34 yo s/p I&D of right breast abscess - Both breast with normal appearance - Continue breast feeding - RTC prn

## 2015-05-19 ENCOUNTER — Ambulatory Visit
Admission: RE | Admit: 2015-05-19 | Discharge: 2015-05-19 | Disposition: A | Discharge status: Home / Self Care | Payer: Medicaid Other | Source: Ambulatory Visit | Attending: Medical | Admitting: Medical

## 2015-05-19 ENCOUNTER — Other Ambulatory Visit: Payer: Self-pay | Admitting: Medical

## 2015-05-19 DIAGNOSIS — N6320 Unspecified lump in the left breast, unspecified quadrant: Secondary | ICD-10-CM

## 2015-05-19 DIAGNOSIS — N611 Abscess of the breast and nipple: Secondary | ICD-10-CM

## 2015-05-22 ENCOUNTER — Other Ambulatory Visit: Payer: Self-pay

## 2015-05-22 MED ORDER — CONCEPT DHA 53.5-38-1 MG PO CAPS: 1.0000 | ORAL_CAPSULE | Freq: Every day | ORAL | Status: AC

## 2015-05-25 ENCOUNTER — Other Ambulatory Visit: Payer: Self-pay | Admitting: Advanced Practice Midwife

## 2015-05-27 ENCOUNTER — Emergency Department (HOSPITAL_COMMUNITY)
Admission: EM | Admit: 2015-05-27 | Discharge: 2015-05-27 | Disposition: A | Discharge status: Home / Self Care | Payer: Medicaid Other | Attending: Emergency Medicine | Admitting: Emergency Medicine

## 2015-05-27 ENCOUNTER — Emergency Department (HOSPITAL_COMMUNITY): Payer: Medicaid Other

## 2015-05-27 ENCOUNTER — Telehealth: Payer: Self-pay | Admitting: Advanced Practice Midwife

## 2015-05-27 ENCOUNTER — Encounter (HOSPITAL_COMMUNITY): Payer: Self-pay | Admitting: Emergency Medicine

## 2015-05-27 DIAGNOSIS — N61 Mastitis without abscess: Secondary | ICD-10-CM | POA: Diagnosis not present

## 2015-05-27 DIAGNOSIS — N644 Mastodynia: Secondary | ICD-10-CM | POA: Diagnosis present

## 2015-05-27 DIAGNOSIS — Z79899 Other long term (current) drug therapy: Secondary | ICD-10-CM | POA: Insufficient documentation

## 2015-05-27 DIAGNOSIS — R0981 Nasal congestion: Secondary | ICD-10-CM | POA: Insufficient documentation

## 2015-05-27 DIAGNOSIS — R05 Cough: Secondary | ICD-10-CM | POA: Diagnosis not present

## 2015-05-27 DIAGNOSIS — Z791 Long term (current) use of non-steroidal anti-inflammatories (NSAID): Secondary | ICD-10-CM | POA: Insufficient documentation

## 2015-05-27 LAB — COMPREHENSIVE METABOLIC PANEL
ALT: 36 U/L (ref 14–54)
ANION GAP: 11 (ref 5–15)
AST: 64 U/L — ABNORMAL HIGH (ref 15–41)
Albumin: 3.4 g/dL — ABNORMAL LOW (ref 3.5–5.0)
Alkaline Phosphatase: 70 U/L (ref 38–126)
BUN: 9 mg/dL (ref 6–20)
CHLORIDE: 101 mmol/L (ref 101–111)
CO2: 23 mmol/L (ref 22–32)
CREATININE: 0.97 mg/dL (ref 0.44–1.00)
Calcium: 8.9 mg/dL (ref 8.9–10.3)
GFR calc non Af Amer: >60 mL/min (ref >60)
Glucose, Bld: 153 mg/dL — ABNORMAL HIGH (ref 65–99)
Potassium: 3.4 mmol/L — ABNORMAL LOW (ref 3.5–5.1)
SODIUM: 135 mmol/L (ref 135–145)
Total Bilirubin: 0.6 mg/dL (ref 0.3–1.2)
Total Protein: 8.1 g/dL (ref 6.5–8.1)

## 2015-05-27 LAB — URINALYSIS, ROUTINE W REFLEX MICROSCOPIC
Bilirubin Urine: NEGATIVE
Glucose, UA: NEGATIVE mg/dL
Ketones, ur: NEGATIVE mg/dL
NITRITE: NEGATIVE
PROTEIN: NEGATIVE mg/dL
Specific Gravity, Urine: 1.008 (ref 1.005–1.030)
pH: 6.5 (ref 5.0–8.0)

## 2015-05-27 LAB — CBC WITH DIFFERENTIAL/PLATELET
Basophils Absolute: 0 10*3/uL (ref 0.0–0.1)
Basophils Relative: 0 %
EOS ABS: 0.1 10*3/uL (ref 0.0–0.7)
EOS PCT: 0 %
HCT: 40.6 % (ref 36.0–46.0)
Hemoglobin: 11.5 g/dL — ABNORMAL LOW (ref 12.0–15.0)
LYMPHS ABS: 1 10*3/uL (ref 0.7–4.0)
Lymphocytes Relative: 9 %
MCH: 28.7 pg (ref 26.0–34.0)
MCHC: 28.3 g/dL — ABNORMAL LOW (ref 30.0–36.0)
MCV: 101.2 fL — ABNORMAL HIGH (ref 78.0–100.0)
Monocytes Absolute: 0.9 10*3/uL (ref 0.1–1.0)
Monocytes Relative: 8 %
Neutro Abs: 9.1 10*3/uL — ABNORMAL HIGH (ref 1.7–7.7)
Neutrophils Relative %: 83 %
PLATELETS: 253 10*3/uL (ref 150–400)
RBC: 4.01 MIL/uL (ref 3.87–5.11)
RDW: 17.1 % — ABNORMAL HIGH (ref 11.5–15.5)
WBC: 11.1 10*3/uL — AB (ref 4.0–10.5)

## 2015-05-27 LAB — I-STAT CG4 LACTIC ACID, ED: Lactic Acid, Venous: 2.73 mmol/L (ref 0.5–2.0)

## 2015-05-27 LAB — URINE MICROSCOPIC-ADD ON: Bacteria, UA: NONE SEEN

## 2015-05-27 MED ORDER — IBUPROFEN 800 MG PO TABS
800.0000 mg | ORAL_TABLET | Freq: Once | ORAL | Status: AC
  Administered 2015-05-27: 800 mg via ORAL
  Filled 2015-05-27: qty 1

## 2015-05-27 MED ORDER — CEPHALEXIN 500 MG PO CAPS: 500.0000 mg | ORAL_CAPSULE | Freq: Four times a day (QID) | ORAL | Status: DC

## 2015-05-27 MED ORDER — SODIUM CHLORIDE 0.9 % IV BOLUS (SEPSIS)
1000.0000 mL | Freq: Once | INTRAVENOUS | Status: AC
  Administered 2015-05-27: 1000 mL via INTRAVENOUS

## 2015-05-27 NOTE — ED Provider Notes (Signed)
CSN: [id number]     Arrival date & time 05/27/15  1418 History   First MD Initiated Contact with Patient 05/27/15 1513     Chief Complaint  Patient presents with  . Fever  . Breast Pain    Patient is a 34 y.o. female presenting with fever. The history is provided by the patient and the spouse.  Fever Severity:  Moderate Onset quality:  Sudden Duration:  1 day Timing:  Constant Progression:  Worsening Chronicity:  New Relieved by:  Nothing Worsened by:  Nothing tried Associated symptoms: chills, congestion and cough   Associated symptoms: no chest pain, no diarrhea, no dysuria, no headaches and no vomiting   Risk factors: no recent travel   Pt reports fever and breast pain Pt reports she has had breast pain on/off since delivery of child 2 months ago She reports originally had right breast pain, and was placed on ABX and had abscess drainage with some improvement Over past 2 weeks she has noticed left breast pain/redness She also reports recent cough/congestion No abd pain She denies any vag discharge (she reports vaginal delivery) No travel recently  Past Medical History  Diagnosis Date  . Medical history non-contributory    Past Surgical History  Procedure Laterality Date  . No past surgeries     History reviewed. No pertinent family history. Social History  Substance Use Topics  . Smoking status: Never Smoker   . Smokeless tobacco: Never Used  . Alcohol Use: No   OB History    Gravida Para Term Preterm AB TAB SAB Ectopic Multiple Living   2 1 1  1  1   0 1     Review of Systems  Constitutional: Positive for fever, chills and fatigue.  HENT: Positive for congestion.   Respiratory: Positive for cough.   Cardiovascular: Negative for chest pain.  Gastrointestinal: Negative for vomiting, abdominal pain and diarrhea.  Genitourinary: Negative for dysuria and vaginal discharge.  Neurological: Negative for headaches.  All other systems reviewed and are  negative.     Allergies  Review of patient's allergies indicates no known allergies.  Home Medications   Prior to Admission medications   Medication Sig Start Date End Date Taking? Authorizing Provider  Diphenhydramine-PE-APAP (SEVERE COLD) 12.5-5-325 MG TABS Take 2 tablets by mouth daily as needed (for cold).   Yes Historical Provider, MD  ibuprofen (ADVIL,MOTRIN) 200 MG tablet Take 400-600 mg by mouth every 6 (six) hours as needed.   Yes Historical Provider, MD  Prenat-FeFum-FePo-FA-Omega 3 (CONCEPT DHA) 53.5-38-1 MG CAPS Take 1 tablet by mouth daily. 05/22/15  Yes Peggy Constant, MD  vitamin E 400 UNIT capsule Take 400 Units by mouth daily.   Yes Historical Provider, MD  clindamycin (CLEOCIN) 300 MG capsule Take 1 capsule (300 mg total) by mouth 4 (four) times daily. Patient not taking: Reported on 04/28/2015 03/30/15   Kathrynn RunningNoah Bedford Wouk, MD  ibuprofen (ADVIL,MOTRIN) 400 MG tablet TAKE ONE TABLET BY MOUTH EVERY 6 HOURS AS NEEDED 05/26/15   Aviva SignsMarie L Williams, CNM  ibuprofen (ADVIL,MOTRIN) 600 MG tablet Take 1 tablet (600 mg total) by mouth every 6 (six) hours. 03/23/15   Hillary Percell BostonMoen Fitzgerald, MD  polyethylene glycol powder [facility](GLYCOLAX/MIRALAX) powder One capful mixed with liquid once daily 03/30/15   Kathrynn RunningNoah Bedford Wouk, MD   BP 103/80 mmHg  Pulse 109  Temp(Src) 100.6 F (38.1 C) (Oral)  Resp 18  SpO2 99% Physical Exam CONSTITUTIONAL: Well developed/well nourished HEAD: Normocephalic/atraumatic EYES: EOMI/PERRL ENMT: Mucous membranes  moist, uvula midline, no exudates or erythema noted NECK: supple no meningeal signs SPINE/BACK:entire spine nontender CV: S1/S2 noted, no murmurs/rubs/gallops noted Breast - female nurse present - bilateral breast tenderness, left >Right.  No focal abscess noted.  Mild erythema noted to left breast.  No large masses noted in either breast.  Breast are symmetric. LUNGS: Lungs are clear to auscultation bilaterally, no apparent distress ABDOMEN: soft,  nontender, no rebound or guarding, bowel sounds noted throughout abdomen GU:no cva tenderness NEURO: Pt is awake/alert/appropriate, moves all extremitiesx4.   EXTREMITIES: pulses normal/equal, full ROM SKIN: warm, color normal PSYCH: no abnormalities of mood noted, alert and oriented to situation  ED Course  Procedures  3:55 PM Pt with recent mastitis concern for another round of mastitis No signs of breast abscess but does have erythema/pain to left breast She also reports cough as well Will add on CXR She is also at risk for UTI, will check urinalysis She is well appearing and not septic appearing Per records she had previous right breast I&D and has been on keflex/clindamycin previously  Pt speaks english throughout interview 6:04 PM Pt improved Vitals improved BP 103/80 mmHg  Pulse 109  Temp(Src) 100.6 F (38.1 C) (Oral)  Resp 18  SpO2 99% She is in no distress Will start back on keflex (this has been used before) She is not septic appearing Will also send urine cutlture Pt also has persistent cough, she could have viral infection causing fever as well She is appropriate for d/c home Told her to call her OBGYN tomorrow  Labs Review Labs Reviewed  CBC WITH DIFFERENTIAL/PLATELET - Abnormal; Notable for the following:    WBC 11.1 (*)    Hemoglobin 11.5 (*)    MCV 101.2 (*)    MCHC 28.3 (*)    RDW 17.1 (*)    Neutro Abs 9.1 (*)    All other components within normal limits  COMPREHENSIVE METABOLIC PANEL - Abnormal; Notable for the following:    Potassium 3.4 (*)    Glucose, Bld 153 (*)    Albumin 3.4 (*)    AST 64 (*)    All other components within normal limits  URINALYSIS, ROUTINE W REFLEX MICROSCOPIC (NOT AT [facility]) - Abnormal; Notable for the following:    APPearance CLOUDY (*)    Hgb urine dipstick LARGE (*)    Leukocytes, UA MODERATE (*)    All other components within normal limits  URINE MICROSCOPIC-ADD ON - Abnormal; Notable for the following:     Squamous Epithelial / LPF 0-5 (*)    All other components within normal limits  I-STAT CG4 LACTIC ACID, ED - Abnormal; Notable for the following:    Lactic Acid, Venous 2.73 (*)    All other components within normal limits  URINE CULTURE    Imaging Review Dg Chest 2 View  05/27/2015  CLINICAL DATA:  34 year old female Currently breast-feeding two-month-old. Mastitis with erythema of both breasts for 6 weeks. Right breast drainage at the breast center recently. Bilateral breast pain and fever up to 103.8. Subsequent encounter. EXAM: CHEST  2 VIEW COMPARISON:  None. FINDINGS: Lung volumes are within normal limits. Normal cardiac size and mediastinal contours. Visualized tracheal air column is within normal limits. The lungs are clear. No pneumothorax or effusion. No osseous abnormality identified. No subcutaneous gas identified in the chest wall. Negative visualized bowel gas pattern. IMPRESSION: Negative, no acute cardiopulmonary abnormality. Electronically Signed   By: Odessa Fleming M.D.   On: 05/27/2015 16:10  I have personally reviewed and evaluated these images and lab results as part of my medical decision-making.  Medications  sodium chloride 0.9 % bolus 1,000 mL (1,000 mLs Intravenous New Bag/Given 05/27/15 1628)  ibuprofen (ADVIL,MOTRIN) tablet 800 mg (800 mg Oral Given 05/27/15 1628)    MDM   Final diagnoses:  Mastitis    Nursing notes including past medical history and social history reviewed and considered in documentation Previous records reviewed and considered Labs/vital reviewed myself and considered during evaluation xrays/imaging reviewed by myself and considered during evaluation     Zadie Rhine, MD 05/27/15 1806

## 2015-05-27 NOTE — Telephone Encounter (Signed)
Husband Molly Maduro([employer] traderorrer) called on behalf of of pt who does not speak [place]. She has been having ongoing problems w/ mastitis of her right breast. Sx had improved after Keflex, Clinda and [facility], but over the past few days she has developed possible mastitis of left breast and fevers of 102-103. Having severe pain of breast and feeling very ill. Consulted w/ Dr. Macon LargeAnyanwu. Pt instructed [facility] instead of [facility] in case general surgeon is needed or pt is septic. Pt's Husband notified and agrees to plan. CNM discussed pt w/ [facility] Pricilla LovelessScott Goldston [facility].   Culture from 05/05/15 showed few MRSA.

## 2015-05-27 NOTE — ED Notes (Signed)
Pt sts cough and breast pain and fever x 4 days; pt sts hx of mastitis and also having cough

## 2015-05-27 NOTE — ED Notes (Addendum)
pts family member states pt has had pain and redness in bilateral breasts x1.5 months, reports pt was seen at the breast center and had right breast drained, pt is now having bilateral breast pain. Reports fevers up to 103.8. Pt currently breastfeeding 572 month old baby, reports 3 rounds of po abx.

## 2015-05-30 LAB — URINE CULTURE: Culture: >=100000

## 2015-06-01 ENCOUNTER — Telehealth (HOSPITAL_BASED_OUTPATIENT_CLINIC_OR_DEPARTMENT_OTHER): Payer: Self-pay | Admitting: Emergency Medicine

## 2015-06-01 NOTE — Telephone Encounter (Signed)
Post ED Visit - Positive Culture Follow-up  Culture report reviewed by antimicrobial stewardship pharmacist:  []  Enzo BiNathan Batchelder, Pharm.D. [x]  Celedonio MiyamotoJeremy Frens, Pharm.D., BCPS []  Garvin FilaMike Maccia, Pharm.D. []  Georgina PillionElizabeth Martin, Pharm.D., BCPS []  [place]ousevilleMinh Pham, [address] BoulevardPharm.D., BCPS, AAHIVP []  Estella HuskMichelle Turner, Pharm.D., BCPS, AAHIVP []  Tennis Mustassie Stewart, Pharm.D. []  Sherle Poeob Vincent, [place]arm[place]D.  Positive urine culture Treated with cephalexin, organism sensitive to the same and no further patient follow-up is required at this time.  Berle MullMiller, Argus Caraher 06/01/2015, 12:16 PM

## 2015-06-02 ENCOUNTER — Ambulatory Visit
Admission: RE | Admit: 2015-06-02 | Discharge: 2015-06-02 | Disposition: A | Discharge status: Home / Self Care | Payer: Medicaid Other | Source: Ambulatory Visit | Attending: Medical | Admitting: Medical

## 2015-06-02 DIAGNOSIS — N6320 Unspecified lump in the left breast, unspecified quadrant: Secondary | ICD-10-CM

## 2015-06-05 ENCOUNTER — Inpatient Hospital Stay (HOSPITAL_COMMUNITY)
Admission: AD | Admit: 2015-06-05 | Discharge: 2015-06-05 | Disposition: A | Discharge status: Home / Self Care | Payer: Medicaid Other | Source: Ambulatory Visit | Attending: Family Medicine | Admitting: Family Medicine

## 2015-06-05 ENCOUNTER — Encounter (HOSPITAL_COMMUNITY): Payer: Self-pay

## 2015-06-05 DIAGNOSIS — N61 Mastitis without abscess: Secondary | ICD-10-CM | POA: Insufficient documentation

## 2015-06-05 DIAGNOSIS — N644 Mastodynia: Secondary | ICD-10-CM | POA: Diagnosis present

## 2015-06-05 DIAGNOSIS — L03818 Cellulitis of other sites: Secondary | ICD-10-CM

## 2015-06-05 LAB — URINE MICROSCOPIC-ADD ON: Bacteria, UA: NONE SEEN

## 2015-06-05 LAB — CBC WITH DIFFERENTIAL/PLATELET
BASOS PCT: 0 %
Basophils Absolute: 0 10*3/uL (ref 0.0–0.1)
EOS ABS: 0.3 10*3/uL (ref 0.0–0.7)
Eosinophils Relative: 4 %
HEMATOCRIT: 34.6 % — AB (ref 36.0–46.0)
Hemoglobin: 10.9 g/dL — ABNORMAL LOW (ref 12.0–15.0)
Lymphocytes Relative: 21 %
Lymphs Abs: 1.6 10*3/uL (ref 0.7–4.0)
MCH: 27.6 pg (ref 26.0–34.0)
MCHC: 31.5 g/dL (ref 30.0–36.0)
MCV: 87.6 fL (ref 78.0–100.0)
MONO ABS: 0.8 10*3/uL (ref 0.1–1.0)
MONOS PCT: 11 %
Neutro Abs: 4.8 10*3/uL (ref 1.7–7.7)
Neutrophils Relative %: 64 %
Platelets: 435 10*3/uL — ABNORMAL HIGH (ref 150–400)
RBC: 3.95 MIL/uL (ref 3.87–5.11)
RDW: 17.5 % — AB (ref 11.5–15.5)
WBC: 7.5 10*3/uL (ref 4.0–10.5)

## 2015-06-05 LAB — URINALYSIS, ROUTINE W REFLEX MICROSCOPIC
Bilirubin Urine: NEGATIVE
GLUCOSE, UA: NEGATIVE mg/dL
KETONES UR: NEGATIVE mg/dL
Nitrite: NEGATIVE
PROTEIN: NEGATIVE mg/dL
Specific Gravity, Urine: <1.005 — ABNORMAL LOW (ref 1.005–1.030)
pH: 5.5 (ref 5.0–8.0)

## 2015-06-05 LAB — POCT PREGNANCY, URINE: Preg Test, Ur: NEGATIVE

## 2015-06-05 MED ORDER — AMOXICILLIN-POT CLAVULANATE 875-125 MG PO TABS: 1.0000 | ORAL_TABLET | Freq: Two times a day (BID) | ORAL | Status: DC

## 2015-06-05 NOTE — MAU Provider Note (Signed)
History     CSN: [id number]  Arrival date and time: 06/05/15 1048   First Provider Initiated Contact with Patient 06/05/15 1114      Chief Complaint  Patient presents with  . Breast Pain   HPI [name] is a 34 y.o. G2P1011 who presents to MAU today with complaint of pain and redness of the left breast. The patient has had an abscess of the right breast during her postpartum period that has resolved well with antibiotics. She has been followed at the [facility]. She was recently seen for left breast pain and mass and had aspiration of a galactocele. Cultures from the aspiration did not reveal infection. The patient has had multiple courses of antibiotics recently. She is currently taking Clindamycin. She states that her milk supply has decreased and she continues to note bleeding in her breast milk. She denies fever today.   OB History    Gravida Para Term Preterm AB TAB SAB Ectopic Multiple Living   0 1      Past Medical History  Diagnosis Date  . Medical history non-contributory     Past Surgical History  Procedure Laterality Date  . No past surgeries      History reviewed. No pertinent family history.  Social History  Substance Use Topics  . Smoking status: Never Smoker   . Smokeless tobacco: Never Used  . Alcohol Use: No    Allergies: No Known Allergies  Prescriptions prior to admission  Medication Sig Dispense Refill Last Dose  . clindamycin (CLEOCIN) 300 MG capsule Take 300 mg by mouth 4 (four) times daily.   06/05/2015 at Unknown time  . ibuprofen (ADVIL,MOTRIN) 400 MG tablet TAKE ONE TABLET BY MOUTH EVERY 6 HOURS AS NEEDED (Patient taking differently: TAKE ONE TABLET BY MOUTH EVERY 6 HOURS AS NEEDED FOR PAIN) 30 tablet 0 06/05/2015 at Unknown time  . Prenat-FeFum-FePo-FA-Omega 3 (CONCEPT DHA) 53.5-38-1 MG CAPS Take 1 tablet by mouth daily. 30 capsule 2 06/05/2015 at Unknown time  . vitamin E 400 UNIT capsule Take 400 Units by mouth daily.    06/05/2015 at Unknown time  . cephALEXin (KEFLEX) 500 MG capsule Take 1 capsule (500 mg total) by mouth 4 (four) times daily. (Patient not taking: Reported on 06/05/2015) 40 capsule 0 Not Taking at Unknown time  . polyethylene glycol powder (GLYCOLAX/MIRALAX) powder One capful mixed with liquid once daily (Patient not taking: Reported on 06/05/2015) 255 g 6 Not Taking at Unknown time    Review of Systems  Constitutional: Negative for fever, chills and malaise/fatigue.  Genitourinary:       + breast pain, bleeding, erythema    Physical Exam   Blood pressure 95/67, pulse 86, temperature 97.6 F (36.4 C), temperature source Oral, resp. rate 17, currently breastfeeding.  Physical Exam  Nursing note and vitals reviewed. Constitutional: She is oriented to person, place, and time. She appears well-developed and well-nourished. No distress.  HENT:  Head: Normocephalic and atraumatic.  Cardiovascular: Normal rate.   Respiratory: Effort normal. Right breast exhibits tenderness. Right breast exhibits no inverted nipple, no mass, no nipple discharge and no skin change. Left breast exhibits skin change. Left breast exhibits no inverted nipple, no mass, no nipple discharge and no tenderness. Breasts are asymmetrical.    GI: Soft. She exhibits no distension.  Neurological: She is alert and oriented to person, place, and time.  Skin: Skin is warm and dry. No erythema.  Psychiatric:  She has a normal mood and affect.    Results for orders placed or performed during the hospital encounter of 06/05/15 (from the past 24 hour(s))  Urinalysis, Routine w reflex microscopic (not at [facility])     Status: Abnormal   Collection Time: 06/05/15 11:05 AM  Result Value Ref Range   Color, Urine YELLOW YELLOW   APPearance CLEAR CLEAR   Specific Gravity, Urine <1.005 (L) 1.005 - 1.030   pH 5.5 5.0 - 8.0   Glucose, UA NEGATIVE NEGATIVE mg/dL   Hgb urine dipstick SMALL (A) NEGATIVE   Bilirubin Urine NEGATIVE  NEGATIVE   Ketones, ur NEGATIVE NEGATIVE mg/dL   Protein, ur NEGATIVE NEGATIVE mg/dL   Nitrite NEGATIVE NEGATIVE   Leukocytes, UA TRACE (A) NEGATIVE  Urine microscopic-add on     Status: Abnormal   Collection Time: 06/05/15 11:05 AM  Result Value Ref Range   Squamous Epithelial / LPF 0-5 (A) NONE SEEN   WBC, UA 0-5 0 - 5 WBC/hpf   RBC / HPF 0-5 0 - 5 RBC/hpf   Bacteria, UA NONE SEEN NONE SEEN  Pregnancy, urine POC     Status: None   Collection Time: 06/05/15 11:18 AM  Result Value Ref Range   Preg Test, Ur NEGATIVE NEGATIVE  CBC with Differential/Platelet     Status: Abnormal   Collection Time: 06/05/15 11:42 AM  Result Value Ref Range   WBC 7.5 4.0 - 10.5 K/uL   RBC 3.95 3.87 - 5.11 MIL/uL   Hemoglobin 10.9 (L) 12.0 - 15.0 g/dL   HCT 16.134.6 (L) 09.636.0 - 04.546.0 %   MCV 87.6 78.0 - 100.0 fL   MCH 27.6 26.0 - 34.0 pg   MCHC 31.5 30.0 - 36.0 g/dL   RDW 40.917.5 (H) 81.111.5 - 91.415.5 %   Platelets 435 (H) 150 - 400 K/uL   Neutrophils Relative % 64 %   Neutro Abs 4.8 1.7 - 7.7 K/uL   Lymphocytes Relative 21 %   Lymphs Abs 1.6 0.7 - 4.0 K/uL   Monocytes Relative 11 %   Monocytes Absolute 0.8 0.1 - 1.0 K/uL   Eosinophils Relative 4 %   Eosinophils Absolute 0.3 0.0 - 0.7 K/uL   Basophils Relative 0 %   Basophils Absolute 0.0 0.0 - 0.1 K/uL    MAU Course  Procedures None  MDM UPT - negative CBC today Patient has not pumped of breast fed since ~ 0700 today. Will pump in MAU  Lactation called and consulted with patient in MAU. Will provider patient with electronic breast pump for better results.  Discussed with Dr. Adrian BlackwaterStinson, he agrees with plan to trial Augmentin for cellulitis Assessment and Plan  A: Left breast cellulitis  P: Discharge home Rx for Augmentin sent to patient's pharmacy Warning signs for worsening condition discussed Encouraged frequent pumping to avoid engorgement Patient advised [facility] if symptoms persist or worsen Patient may return to  MAU as needed or if her condition were to change or worsen   Marny LowensteinJulie N Cordie Buening, PA-C  06/05/2015, 1:04 PM

## 2015-06-05 NOTE — MAU Note (Signed)
Patient presents with c/o of left breast pain and bleeding since the 20th when she got her breast drained.

## 2015-06-05 NOTE — Discharge Instructions (Signed)
Cellulitis °Cellulitis is an infection of the skin and the tissue under the skin. The infected area is usually red and tender. This happens most often in the arms and lower legs. °HOME CARE  °· Take your antibiotic medicine as told. Finish the medicine even if you start to feel better. °· Keep the infected arm or leg raised (elevated). °· Put a warm cloth on the area up to 4 times per day. °· Only take medicines as told by your doctor. °· Keep all doctor visits as told. °GET HELP IF: °· You see red streaks on the skin coming from the infected area. °· Your red area gets bigger or turns a dark color. °· Your bone or joint under the infected area is painful after the skin heals. °· Your infection comes back in the same area or different area. °· You have a puffy (swollen) bump in the infected area. °· You have new symptoms. °· You have a fever. °GET HELP RIGHT AWAY IF:  °· You feel very sleepy. °· You throw up (vomit) or have watery poop (diarrhea). °· You feel sick and have muscle aches and pains. °  °This information is not intended to replace advice given to you by your health care provider. Make sure you discuss any questions you have with your health care provider. °  °Document Released: 11/16/2007 Document Revised: 02/18/2015 Document Reviewed: 08/15/2011 °Elsevier Interactive Patient Education ©2016 Elsevier Inc. ° °

## 2015-06-05 NOTE — MAU Note (Signed)
Patient spoke with Vonzella NippleJulie Wenzel NP and lactation nurse. Before I could get her to sign and give her her discharge papers she left because she didn't know I had to come back in the room.

## 2015-06-05 NOTE — Lactation Note (Signed)
Lactation Consultation Note  Patient Name: Pam Kennedy VHQIO'NToday's Date: 06/05/2015   Called to [facility] by Nurse Practitioner to see lactating patient who has presented with left breast pain and drainage. Left breast is red and firm under the areola and on the left side of the breast. The site is tender to touch. The patient has had reoccurring mastitis in both breast and a right breast abscess. Patient and husband report that the patient had a needle aspiration on the left breast from the mass on 06/02/15 [facility]. The puncture site is covered with steri strips and covered with gauze pads. Mother pumped with the hospital grade breast pump and removed 30 ml of bloody drainage in her milk from the left breast and 5 ml of clear breast from the right breast. The right breast is soft and non-tender. She continues to latch baby to right breast reporting baby pulls off and her milk supply is scant. Mother reports some relief of pressure from the left breast after pumping. Mother has a [employer] personal pump that she reports is painful. Hospital grade loaner pump provided for patient to assist with ease of milk flow. Mother advised to discard blood tinged breast milk until clear milk is noted with pumping. Mother has used most of her stored breast milk and will start formula supplementation as needed. Patient's antibiotic was changed to Augmentum today. Per NP note, no growth on cultures of breast milk from procedure this past week. Patient to follow up with MD if symptoms continue. Patient has phone number to Lactation service. Instructed to pump with gentle suction, both breast, every 3 hours for 10-15 minutes. To continue pumping until breast starts to soften and symptoms decrease.    Maternal Data    Feeding    LATCH Score/Interventions                      Lactation Tools Discussed/Used     Consult Status      Omar PersonDaly, Shalah Estelle M 06/05/2015, 1:32 PM

## 2015-06-06 LAB — CULTURE, ROUTINE-ABSCESS: CULTURE: NO GROWTH

## 2015-06-25 ENCOUNTER — Ambulatory Visit: Payer: Medicaid Other | Admitting: Obstetrics and Gynecology

## 2015-07-06 ENCOUNTER — Telehealth: Payer: Self-pay | Admitting: General Practice

## 2015-07-06 NOTE — Telephone Encounter (Signed)
Patient's husband called stating Pam Kennedy is not feeling well again. She is having breast pain and drainage from the left side and has been running a fever. He states they cannot get here till after 4 today. Spoke with Dr Jolayne Panther, who states patient can come in tomorrow morning. Asked him if they could be here tomorrow at 815. He states they can. Patient's husband had no other questions

## 2015-07-07 ENCOUNTER — Encounter: Payer: Self-pay | Admitting: Advanced Practice Midwife

## 2015-07-07 ENCOUNTER — Ambulatory Visit (INDEPENDENT_AMBULATORY_CARE_PROVIDER_SITE_OTHER): Payer: BLUE CROSS/BLUE SHIELD | Admitting: Advanced Practice Midwife

## 2015-07-07 ENCOUNTER — Other Ambulatory Visit: Payer: Self-pay | Admitting: Obstetrics and Gynecology

## 2015-07-07 VITALS — BP 97/73 | HR 93 | Temp 98.5°F | Wt 135.9 lb

## 2015-07-07 DIAGNOSIS — N61 Mastitis without abscess: Secondary | ICD-10-CM | POA: Diagnosis not present

## 2015-07-07 DIAGNOSIS — Z124 Encounter for screening for malignant neoplasm of cervix: Secondary | ICD-10-CM | POA: Diagnosis not present

## 2015-07-07 DIAGNOSIS — Z113 Encounter for screening for infections with a predominantly sexual mode of transmission: Secondary | ICD-10-CM | POA: Diagnosis not present

## 2015-07-07 MED ORDER — DICLOXACILLIN SODIUM 250 MG PO CAPS: 250.0000 mg | ORAL_CAPSULE | Freq: Four times a day (QID) | ORAL | Status: DC

## 2015-07-07 NOTE — Patient Instructions (Signed)
Breastfeeding and Mastitis Mastitis is inflammation of the breast tissue. It can occur in women who are breastfeeding. This can make breastfeeding painful. Mastitis will sometimes go away on its own. Your health care provider will help determine if treatment is needed. CAUSES Mastitis is often associated with a blocked milk (lactiferous) duct. This can happen when too much milk builds up in the breast. Causes of excess milk in the breast can include:  Poor latch-on. If your baby is not latched onto the breast properly, she or he may not empty your breast completely while breastfeeding.  Allowing too much time to pass between feedings.  Wearing a bra or other clothing that is too tight. This puts extra pressure on the lactiferous ducts so milk does not flow through them as it should. Mastitis can also be caused by a bacterial infection. Bacteria may enter the breast tissue through cuts or openings in the skin. In women who are breastfeeding, this may occur because of cracked or irritated skin. Cracks in the skin are often caused when your baby does not latch on properly to the breast. SIGNS AND SYMPTOMS  Swelling, redness, tenderness, and pain in an area of the breast.  Swelling of the glands under the arm on the same side.  Fever may or may not accompany mastitis. If an infection is allowed to progress, a collection of pus (abscess) may develop. DIAGNOSIS  Your health care provider can usually diagnose mastitis based on your symptoms and a physical exam. Tests may be done to help confirm the diagnosis. These may include:  Removal of pus from the breast by applying pressure to the area. This pus can be examined in the lab to determine which bacteria are present. If an abscess has developed, the fluid in the abscess can be removed with a needle. This can also be used to confirm the diagnosis and determine the bacteria present. In most cases, pus will not be present.  Blood tests to determine if  your body is fighting a bacterial infection.  Mammogram or ultrasound tests to rule out other problems or diseases. TREATMENT  Mastitis that occurs with breastfeeding will sometimes go away on its own. Your health care provider may choose to wait 24 hours after first seeing you to decide whether a prescription medicine is needed. If your symptoms are worse after 24 hours, your health care provider will likely prescribe an antibiotic medicine to treat the mastitis. He or she will determine which bacteria are most likely causing the infection and will then select an appropriate antibiotic medicine. This is sometimes changed based on the results of tests performed to identify the bacteria, or if there is no response to the antibiotic medicine selected. Antibiotic medicines are usually given by mouth. You may also be given medicine for pain. HOME CARE INSTRUCTIONS  Only take over-the-counter or prescription medicines for pain, fever, or discomfort as directed by your health care provider.  If your health care provider prescribed an antibiotic medicine, take the medicine as directed. Make sure you finish it even if you start to feel better.  Do not wear a tight or underwire bra. Wear a soft, supportive bra.  Increase your fluid intake, especially if you have a fever.  Continue to empty the breast. Your health care provider can tell you whether this milk is safe for your infant or needs to be thrown out. You may be told to stop nursing until your health care provider thinks it is safe for your baby.   Use a breast pump if you are advised to stop nursing.  Keep your nipples clean and dry.  Empty the first breast completely before going to the other breast. If your baby is not emptying your breasts completely for some reason, use a breast pump to empty your breasts.  If you go back to work, pump your breasts while at work to stay in time with your nursing schedule.  Avoid allowing your breasts to become  overly filled with milk (engorged). SEEK MEDICAL CARE IF:  You have pus-like discharge from the breast.  Your symptoms do not improve with the treatment prescribed by your health care provider within 2 days. SEEK IMMEDIATE MEDICAL CARE IF:  Your pain and swelling are getting worse.  You have pain that is not controlled with medicine.  You have a red line extending from the breast toward your armpit.  You have a fever or persistent symptoms for more than 2-3 days.  You have a fever and your symptoms suddenly get worse. MAKE SURE YOU:   Understand these instructions.  Will watch your condition.  Will get help right away if you are not doing well or get worse.   This information is not intended to replace advice given to you by your health care provider. Make sure you discuss any questions you have with your health care provider.   Document Released: 09/24/2004 Document Revised: 06/04/2013 Document Reviewed: 01/03/2013 Elsevier Interactive Patient Education 2016 Elsevier Inc.  

## 2015-07-07 NOTE — Progress Notes (Signed)
   Subjective:    Patient ID: Pam Kennedy, female    DOB: 1981/03/15, 35 y.o.   MRN: [id number]  HPI This is a 35 y.o. female who is about 3-4 months postpartum who presents with recurrent mastitis. Has been treated multiple times for mastitis with recurrence and drainage of abscess formed.  See problem list for list of events and antibiotics used.   Breast center aspiration yielded few MRSA. 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 Tries pumping and feeding and cannot get any milk to come out of that side. So she feeds from right.   Denies current fever or aches   Review of Systems  Constitutional: Negative for fever (but did have some off and on) and chills.  Respiratory: Negative for shortness of breath.   Cardiovascular: Positive for chest pain (left breast).  Gastrointestinal: Negative for nausea, vomiting, abdominal pain, diarrhea and constipation.  Genitourinary: Negative for dysuria, vaginal bleeding and pelvic pain.  Musculoskeletal: Negative for back pain.  Skin: Positive for color change.       Objective:   Physical Exam  Constitutional: She is oriented to person, place, and time. She appears well-developed and well-nourished. No distress.  HENT:  Head: Normocephalic.  Cardiovascular: Normal rate and regular rhythm.   Pulmonary/Chest: Effort normal. No respiratory distress.    Large area of erethema and tenderness Center area is raised and shiny, suspect abscess   Abdominal: Soft. There is no tenderness.  Musculoskeletal: Normal range of motion.  Neurological: She is alert and oriented to person, place, and time.  Skin: Skin is warm and dry.  Psychiatric: She has a normal mood and affect.          Assessment & Plan:  A;  Recurrent mastitis left breast      Probable abscess  P:  Consulted Dr Jolayne Panther       She recommends immediate referral to breast center and infectious  disease       I did a chart review and listed chronology of treatments under problem list       Appts made as above       Encouraged to keep try ing to empty breasts

## 2015-07-07 NOTE — Progress Notes (Signed)
Called Infectious Disease. They received referral, they will call patient with appointment within 7 days Also made appt for the [facility] for 07/08/15.

## 2015-07-08 ENCOUNTER — Other Ambulatory Visit: Payer: Self-pay | Admitting: General Surgery

## 2015-07-08 ENCOUNTER — Observation Stay (HOSPITAL_COMMUNITY): Payer: BLUE CROSS/BLUE SHIELD | Admitting: Anesthesiology

## 2015-07-08 ENCOUNTER — Ambulatory Visit: Payer: Self-pay | Admitting: General Surgery

## 2015-07-08 ENCOUNTER — Encounter (HOSPITAL_COMMUNITY): Admission: AD | Disposition: A | Discharge status: Home / Self Care | Payer: Self-pay | Source: Ambulatory Visit

## 2015-07-08 ENCOUNTER — Inpatient Hospital Stay (HOSPITAL_COMMUNITY)
Admission: AD | Admit: 2015-07-08 | Discharge: 2015-07-13 | DRG: 601 | Disposition: A | Discharge status: Home / Self Care | Payer: BLUE CROSS/BLUE SHIELD | Source: Ambulatory Visit | Attending: General Surgery | Admitting: General Surgery

## 2015-07-08 ENCOUNTER — Encounter (HOSPITAL_COMMUNITY): Payer: Self-pay | Admitting: Anesthesiology

## 2015-07-08 ENCOUNTER — Ambulatory Visit
Admission: RE | Admit: 2015-07-08 | Discharge: 2015-07-08 | Disposition: A | Discharge status: Home / Self Care | Payer: BLUE CROSS/BLUE SHIELD | Source: Ambulatory Visit | Attending: Advanced Practice Midwife | Admitting: Advanced Practice Midwife

## 2015-07-08 DIAGNOSIS — N61 Mastitis without abscess: Secondary | ICD-10-CM

## 2015-07-08 DIAGNOSIS — N611 Abscess of the breast and nipple: Secondary | ICD-10-CM | POA: Insufficient documentation

## 2015-07-08 DIAGNOSIS — A4902 Methicillin resistant Staphylococcus aureus infection, unspecified site: Secondary | ICD-10-CM | POA: Diagnosis present

## 2015-07-08 HISTORY — PX: INCISION AND DRAINAGE ABSCESS: SHX5864

## 2015-07-08 HISTORY — DX: Abscess of the breast and nipple: N61.1

## 2015-07-08 LAB — CBC
HCT: 38.2 % (ref 36.0–46.0)
Hemoglobin: 12.3 g/dL (ref 12.0–15.0)
MCH: 28.5 pg (ref 26.0–34.0)
MCHC: 32.2 g/dL (ref 30.0–36.0)
MCV: 88.6 fL (ref 78.0–100.0)
Platelets: 297 10*3/uL (ref 150–400)
RBC: 4.31 MIL/uL (ref 3.87–5.11)
RDW: 15.3 % (ref 11.5–15.5)
WBC: 15 10*3/uL — ABNORMAL HIGH (ref 4.0–10.5)

## 2015-07-08 LAB — HCG, SERUM, QUALITATIVE: PREG SERUM: NEGATIVE

## 2015-07-08 SURGERY — INCISION AND DRAINAGE, ABSCESS
Anesthesia: General | Site: Breast | Laterality: Left

## 2015-07-08 MED ORDER — ONDANSETRON 4 MG PO TBDP: 4.0000 mg | ORAL_TABLET | Freq: Four times a day (QID) | ORAL | Status: DC | PRN

## 2015-07-08 MED ORDER — PROPOFOL 10 MG/ML IV BOLUS
INTRAVENOUS | Status: DC | PRN
  Administered 2015-07-08: 110 mg via INTRAVENOUS

## 2015-07-08 MED ORDER — HYDROMORPHONE HCL 1 MG/ML IJ SOLN
INTRAMUSCULAR | Status: AC
  Filled 2015-07-08: qty 1

## 2015-07-08 MED ORDER — PROPOFOL 10 MG/ML IV BOLUS
INTRAVENOUS | Status: AC
  Filled 2015-07-08: qty 20

## 2015-07-08 MED ORDER — MORPHINE SULFATE (PF) 4 MG/ML IV SOLN: 2.0000 mg | INTRAVENOUS | Status: AC | PRN

## 2015-07-08 MED ORDER — ONDANSETRON HCL 4 MG/2ML IJ SOLN
INTRAMUSCULAR | Status: DC | PRN
  Administered 2015-07-08: 4 mg via INTRAVENOUS

## 2015-07-08 MED ORDER — FENTANYL CITRATE (PF) 100 MCG/2ML IJ SOLN
INTRAMUSCULAR | Status: DC | PRN
  Administered 2015-07-08 (×3): 50 ug via INTRAVENOUS

## 2015-07-08 MED ORDER — MIDAZOLAM HCL 5 MG/5ML IJ SOLN
INTRAMUSCULAR | Status: DC | PRN
  Administered 2015-07-08: 2 mg via INTRAVENOUS

## 2015-07-08 MED ORDER — FENTANYL CITRATE (PF) 250 MCG/5ML IJ SOLN
INTRAMUSCULAR | Status: AC
  Filled 2015-07-08: qty 5

## 2015-07-08 MED ORDER — HYDROMORPHONE HCL 1 MG/ML IJ SOLN
0.5000 mg | INTRAMUSCULAR | Status: DC | PRN
  Administered 2015-07-08 – 2015-07-12 (×6): 1 mg via INTRAVENOUS
  Filled 2015-07-08 (×7): qty 1

## 2015-07-08 MED ORDER — ONDANSETRON 4 MG PO TBDP: 4.0000 mg | ORAL_TABLET | Freq: Four times a day (QID) | ORAL | Status: AC | PRN

## 2015-07-08 MED ORDER — ENOXAPARIN SODIUM 40 MG/0.4ML ~~LOC~~ SOLN
40.0000 mg | SUBCUTANEOUS | Status: DC
  Administered 2015-07-09 – 2015-07-12 (×4): 40 mg via SUBCUTANEOUS
  Filled 2015-07-08 (×3): qty 0.4

## 2015-07-08 MED ORDER — PIPERACILLIN-TAZOBACTAM 3.375 G IVPB
3.3750 g | Freq: Three times a day (TID) | INTRAVENOUS | Status: DC
  Administered 2015-07-08 – 2015-07-13 (×15): 3.375 g via INTRAVENOUS
  Filled 2015-07-08 (×18): qty 50

## 2015-07-08 MED ORDER — PROMETHAZINE HCL 25 MG/ML IJ SOLN: 6.2500 mg | INTRAMUSCULAR | Status: DC | PRN

## 2015-07-08 MED ORDER — ONDANSETRON HCL 4 MG/2ML IJ SOLN: 4.0000 mg | Freq: Four times a day (QID) | INTRAMUSCULAR | Status: DC | PRN

## 2015-07-08 MED ORDER — 0.9 % SODIUM CHLORIDE (POUR BTL) OPTIME
TOPICAL | Status: DC | PRN
  Administered 2015-07-08: 1000 mL

## 2015-07-08 MED ORDER — LACTATED RINGERS IV SOLN
INTRAVENOUS | Status: DC | PRN
  Administered 2015-07-08: 21:00:00 via INTRAVENOUS

## 2015-07-08 MED ORDER — SODIUM CHLORIDE 0.9 % IV SOLN: INTRAVENOUS | Status: AC

## 2015-07-08 MED ORDER — HYDROMORPHONE HCL 1 MG/ML IJ SOLN
0.2500 mg | INTRAMUSCULAR | Status: DC | PRN
Start: 2015-07-08 — End: 2015-07-08
  Administered 2015-07-08 (×2): 0.5 mg via INTRAVENOUS

## 2015-07-08 MED ORDER — KCL IN DEXTROSE-NACL 20-5-0.45 MEQ/L-%-% IV SOLN
INTRAVENOUS | Status: DC
  Administered 2015-07-08 – 2015-07-12 (×7): via INTRAVENOUS
  Administered 2015-07-12: 75 mL via INTRAVENOUS
  Filled 2015-07-08 (×9): qty 1000

## 2015-07-08 MED ORDER — SODIUM CHLORIDE 0.9 % IV SOLN: 4.0000 mg | Freq: Four times a day (QID) | INTRAVENOUS | Status: AC | PRN

## 2015-07-08 MED ORDER — LIDOCAINE HCL (CARDIAC) 20 MG/ML IV SOLN
INTRAVENOUS | Status: DC | PRN
  Administered 2015-07-08: 80 mg via INTRAVENOUS

## 2015-07-08 MED ORDER — MIDAZOLAM HCL 2 MG/2ML IJ SOLN
INTRAMUSCULAR | Status: AC
  Filled 2015-07-08: qty 2

## 2015-07-08 SURGICAL SUPPLY — 23 items
BNDG GAUZE ELAST 4 BULKY (GAUZE/BANDAGES/DRESSINGS) IMPLANT
CANISTER SUCTION 2500CC (MISCELLANEOUS) ×3 IMPLANT
COVER SURGICAL LIGHT HANDLE (MISCELLANEOUS) ×3 IMPLANT
DRAPE LAPAROSCOPIC ABDOMINAL (DRAPES) ×3 IMPLANT
DRAPE UTILITY XL STRL (DRAPES) ×6 IMPLANT
DRSG PAD ABDOMINAL 8X10 ST (GAUZE/BANDAGES/DRESSINGS) ×3 IMPLANT
ELECT CAUTERY BLADE 6.4 (BLADE) ×3 IMPLANT
ELECT REM PT RETURN 9FT ADLT (ELECTROSURGICAL) ×3
ELECTRODE REM PT RTRN 9FT ADLT (ELECTROSURGICAL) ×1 IMPLANT
GAUZE IODOFORM PACK 1/2 7832 (GAUZE/BANDAGES/DRESSINGS) ×6 IMPLANT
GAUZE SPONGE 4X4 12PLY STRL (GAUZE/BANDAGES/DRESSINGS) ×3 IMPLANT
GOWN STRL REUS W/ TWL LRG LVL3 (GOWN DISPOSABLE) ×2 IMPLANT
GOWN STRL REUS W/TWL LRG LVL3 (GOWN DISPOSABLE) ×4
KIT BASIN OR (CUSTOM PROCEDURE TRAY) ×3 IMPLANT
KIT ROOM TURNOVER OR (KITS) ×3 IMPLANT
NS IRRIG 1000ML POUR BTL (IV SOLUTION) ×3 IMPLANT
PACK GENERAL/GYN (CUSTOM PROCEDURE TRAY) ×3 IMPLANT
PAD ARMBOARD 7.5X6 YLW CONV (MISCELLANEOUS) ×3 IMPLANT
SWAB COLLECTION DEVICE MRSA (MISCELLANEOUS) ×3 IMPLANT
TAPE CLOTH SURG 4X10 WHT LF (GAUZE/BANDAGES/DRESSINGS) ×3 IMPLANT
TOWEL OR 17X24 6PK STRL BLUE (TOWEL DISPOSABLE) ×3 IMPLANT
TOWEL OR 17X26 10 PK STRL BLUE (TOWEL DISPOSABLE) ×3 IMPLANT
TUBE ANAEROBIC SPECIMEN COL (MISCELLANEOUS) ×3 IMPLANT

## 2015-07-08 NOTE — Op Note (Signed)
OPERATIVE REPORT  DATE OF OPERATION: 07/08/2015  PATIENT:  Pam Kennedy  35 y.o. female  PRE-OPERATIVE DIAGNOSIS:  Left breast abscess  POST-OPERATIVE DIAGNOSIS:  Same  PROCEDURE:  Procedure(s): INCISION AND DRAINAGE LEFT BREAST ABSCESS  SURGEON:  Surgeon(s): Jimmye Norman, MD  ASSISTANT: None  ANESTHESIA:   general  EBL: 50 ml  BLOOD ADMINISTERED: none  DRAINS: Two bottles of 1/2 inch Iodoform Nugauze packing   SPECIMEN:  Source of Specimen:  Pus left breast for cultures  COUNTS CORRECT:  YES  PROCEDURE DETAILS: The patient was taken to the operating room and placed on the table in supine position. After an adequate laryngeal airway anesthetic was administered she was prepped and draped in usual sterile manner in her left breast.  A proper timeout was performed identifying the patient and procedure to be performed. A lateral periareolar incision was made using a #15 blade down into a large abscess cavity which comprised most of the lateral one third of the left breast. A large amount of yellow creamy mucopurulent fluid was relieved. Aerobic and anaerobic cultures were sent. We irrigated with saline solution then packed the wound with 2 bottles of half inch iodoform Nu Gauze. A sterile dressing was applied. All counts were correct.  PATIENT DISPOSITION:  PACU - hemodynamically stable.   Shaana Acocella 1/25/201710:02 PM

## 2015-07-08 NOTE — Transfer of Care (Signed)
Immediate Anesthesia Transfer of Care Note  Patient: Pam Kennedy  Procedure(s) Performed: Procedure(s): INCISION AND DRAINAGE LEFT BREAST ABSCESS (Left)  Patient Location: PACU  Anesthesia Type:General  Level of Consciousness: awake, alert  and oriented  Airway & Oxygen Therapy: Patient Spontanous Breathing  Post-op Assessment: Report given to RN and Post -op Vital signs reviewed and stable  Post vital signs: Reviewed and stable  Last Vitals:  Filed Vitals:   07/08/15 1757 07/08/15 1825  BP: 106/77 97/59  Pulse: 93 89  Temp: 36.9 C 37 C  Resp: 18 16    Complications: No apparent anesthesia complications

## 2015-07-08 NOTE — Progress Notes (Signed)
ANTIBIOTIC CONSULT NOTE - INITIAL  Pharmacy Consult for Zosyn Indication: Breast abscess  No Known Allergies  Patient Measurements: TBW 61.6 kg  Vital Signs:   Intake/Output from previous day:   Intake/Output from this shift:    Labs: No results for input(s): WBC, HGB, PLT, LABCREA, CREATININE in the last 72 hours. CrCl cannot be calculated (Patient has no serum creatinine result on file.). No results for input(s): VANCOTROUGH, VANCOPEAK, VANCORANDOM, GENTTROUGH, GENTPEAK, GENTRANDOM, TOBRATROUGH, TOBRAPEAK, TOBRARND, AMIKACINPEAK, AMIKACINTROU, AMIKACIN in the last 72 hours.   Microbiology: No results found for this or any previous visit (from the past 720 hour(s)).  Medical History: Past Medical History  Diagnosis Date  . Medical history non-contributory     Assessment: 34 yo F presents on 1/24 with L breast abscess and recurrent mastitis. Pharmacy consulted to start Zosyn for abscess. Afebrile, WBC 11.1 (last month). SCr in December was stable. CrCl ~14ml/min  Goal of Therapy:  Resolution of infection  Plan:  Start Zosyn 3.375 gm IV q8h (4 hour infusion) Monitor clinical picture, renal function F/U C&S, abx deescalation / LOT  Enzo Bi, PharmD, BCPS Clinical Pharmacist Pager [phone] 07/08/2015 5:41 PM

## 2015-07-08 NOTE — H&P (Signed)
Pam Kennedy is a 35 year old patient who is referred to [place] by the breast center for evaluation of a left breast abscess. The patient is approximately 3-1/2 months postpartum who had been seen by her obstetrician today for recurrent pain and swelling to the left breast. She had been on antibiotics previously for similar presentation. Her first bout of this started in November, 1 month following delivery of her child. She had also been seen at the breast center for aspiration of a galactocele. The patient says that she has never completely recovered from any of these episodes. After being seen in obstetrics this morning, she was referred to the breast center for imaging. Breast ultrasound revealed a complex fluid collection on the lateral left breast extending from 12 to 4:00, the greatest collection measuring approximately 5 cm. The patient has been breast feeding, mostly on the right, but notes that there has been a small amount of milk from the left-hand side. The patient denies fever.   Other Problems Doristine Devoid, CMA; 07/08/2015 4:03 PM) Lump In Breast  Diagnostic Studies History Doristine Devoid, CMA; 07/08/2015 4:03 PM) Mammogram within last year Pap Smear 1-5 years ago  Allergies Doristine Devoid, CMA; 07/08/2015 4:04 PM) No Known Drug Allergies01/25/2017  Medication History Doristine Devoid, CMA; 07/08/2015 4:06 PM) Concept DHA (53.5-38-1MG  Capsule, Oral) Active. Ibuprofen (  Tablet, Oral) Active. Vitamin E (1000UNIT Capsule, Oral) Active. Amoxicillin (  Capsule, Oral) Active.  Pregnancy / Birth History Doristine Devoid, CMA; 07/08/2015 4:03 PM) Age at menarche 9 years. Gravida 1 Irregular periods Maternal age 63-35 Para 1    Review of Systems Doristine Devoid CMA; 07/08/2015 4:03 PM) HEENT Not Present- Earache, Hearing Loss, Hoarseness, Nose Bleed, Oral Ulcers, Ringing in the Ears, Seasonal Allergies, Sinus Pain, Sore Throat, Visual Disturbances, Wears  glasses/contact lenses and Yellow Eyes. Respiratory Not Present- Bloody sputum, Chronic Cough, Difficulty Breathing, Snoring and Wheezing. Cardiovascular Not Present- Chest Pain, Difficulty Breathing Lying Down, Leg Cramps, Palpitations, Rapid Heart Rate, Shortness of Breath and Swelling of Extremities. Neurological Not Present- Decreased Memory, Fainting, Headaches, Numbness, Seizures, Tingling, Tremor, Trouble walking and Weakness. Psychiatric Not Present- Anxiety, Bipolar, Change in Sleep Pattern, Depression, Fearful and Frequent crying. Hematology Not Present- Easy Bruising, Excessive bleeding, Gland problems, HIV and Persistent Infections.  Vitals (Chemira Jones CMA; 07/08/2015 4:04 PM) 07/08/2015 4:03 PM Weight: 133.4 lb Height: 64in Body Surface Area: 1.65 m Body Mass Index: 22.9 kg/m  Temp.: 97.28F  Pulse: 94 (Regular)  BP: 100/62 (Sitting, Left Arm, Standard)       Physical Exam (Andy Liepins PA C; 07/08/2015 4:20 PM) General Mental Status-Alert. General Appearance-Cooperative, Not in acute distress. Orientation-Oriented X4.  Integumentary General Characteristics Overall examination of the patient's skin reveals - no rashes. Color - normal coloration of skin. Skin Moisture - normal skin moisture.  Head and Neck Head-normocephalic, atraumatic with no lesions or palpable masses.  Chest and Lung Exam Chest and lung exam reveals -quiet, even and easy respiratory effort with no use of accessory muscles and on auscultation, normal breath sounds, no adventitious sounds and normal vocal resonance.  Breast Note: Her left breast has a large, tense, erythematous area that occupies the lateral half of her breast consistent with mastitis and abscess. It is exquisitely tender. There is no nipple discharge or retraction. The right breast appears completely normal.   Cardiovascular Cardiovascular examination reveals -normal heart sounds, regular rate and  rhythm with no murmurs.  Abdomen Palpation/Percussion Palpation and Percussion of the abdomen reveal - Soft and Non Tender.  Neurologic  Neurologic evaluation reveals -normal attention span and ability to concentrate and able to name objects and repeat phrases. Appropriate fund of knowledge .  Musculoskeletal Global Assessment Gait and Station - normal gait and station and normal posture.    Assessment & Plan Mardelle Matte Liepins PA C; 07/08/2015 4:16 PM) LEFT BREAST ABSCESS (N61.1) Impression: The patient was examined and discussed with Dr. Dwain Sarna. Given the complex nature and recurrence of this abscess and its size, it would not be amenable to drainage in the office. She will need to be admitted to the hospital, placed on intravenous antibiotics, and likely taken to the operating room semi-urgently for incision and drainage. The patient and family were made aware that she may need to spend at least 1-2 nights in the hospital and will likely also have an open wound following her procedure. She and her family voiced understanding and agreement.

## 2015-07-08 NOTE — Anesthesia Preprocedure Evaluation (Addendum)
Anesthesia Evaluation  Patient identified by MRN, date of birth, ID band Patient awake    Reviewed: Allergy & Precautions, NPO status , Patient's Chart, lab work & pertinent test results  Airway Mallampati: I  TM Distance: >3 FB Neck ROM: Full    Dental  (+) Teeth Intact   Pulmonary neg pulmonary ROS,    breath sounds clear to auscultation       Cardiovascular negative cardio ROS   Rhythm:Regular Rate:Normal     Neuro/Psych negative neurological ROS  negative psych ROS   GI/Hepatic negative GI ROS, Neg liver ROS,   Endo/Other  negative endocrine ROS  Renal/GU negative Renal ROS  negative genitourinary   Musculoskeletal negative musculoskeletal ROS (+)   Abdominal   Peds negative pediatric ROS (+)  Hematology negative hematology ROS (+)   Anesthesia Other Findings   Reproductive/Obstetrics negative OB ROS                             Anesthesia Physical Anesthesia Plan  ASA: I  Anesthesia Plan: General   Post-op Pain Management:    Induction: Intravenous  Airway Management Planned: LMA  Additional Equipment:   Intra-op Plan:   Post-operative Plan: Extubation in OR  Informed Consent: I have reviewed the patients History and Physical, chart, labs and discussed the procedure including the risks, benefits and alternatives for the proposed anesthesia with the patient or authorized representative who has indicated his/her understanding and acceptance.   Dental advisory given  Plan Discussed with: CRNA, Surgeon and Anesthesiologist  Anesthesia Plan Comments:        Anesthesia Quick Evaluation

## 2015-07-08 NOTE — Progress Notes (Signed)
Pt ate and drank less than an hour ago so surgery rescheduled for later tonight per Dr. Ivin Booty. Dr. Lindie Spruce present and aware. Dr. Lindie Spruce placing bed request and admission orders, will monitor for bed placement.

## 2015-07-08 NOTE — Anesthesia Procedure Notes (Signed)
Procedure Name: LMA Insertion Date/Time: 07/08/2015 9:38 PM Performed by: Arlice Colt B Pre-anesthesia Checklist: Patient identified, Emergency Drugs available, Suction available, Patient being monitored and Timeout performed Patient Re-evaluated:Patient Re-evaluated prior to inductionOxygen Delivery Method: Circle system utilized Preoxygenation: Pre-oxygenation with 100% oxygen Intubation Type: IV induction LMA: LMA inserted LMA Size: 4.0 Number of attempts: 1 Placement Confirmation: positive ETCO2 and breath sounds checked- equal and bilateral Tube secured with: Tape Dental Injury: Teeth and Oropharynx as per pre-operative assessment

## 2015-07-08 NOTE — Anesthesia Postprocedure Evaluation (Signed)
Anesthesia Post Note  Patient: Pam Kennedy  Procedure(s) Performed: Procedure(s) (LRB): INCISION AND DRAINAGE LEFT BREAST ABSCESS (Left)  Patient location during evaluation: PACU Anesthesia Type: General Level of consciousness: sedated and awake Pain management: pain level controlled Vital Signs Assessment: post-procedure vital signs reviewed and stable Respiratory status: spontaneous breathing, nonlabored ventilation, respiratory function stable and patient connected to nasal cannula oxygen Cardiovascular status: blood pressure returned to baseline and stable Postop Assessment: no signs of nausea or vomiting Anesthetic complications: no    Last Vitals:  Filed Vitals:   07/08/15 2228 07/08/15 2230  BP:  104/69  Pulse: 91 92  Temp:  36.6 C  Resp: 21 20    Last Pain:  Filed Vitals:   07/08/15 2234  PainSc: 4                  [name],JAMES TERRILL

## 2015-07-09 ENCOUNTER — Encounter (HOSPITAL_COMMUNITY): Payer: Self-pay | Admitting: General Surgery

## 2015-07-09 DIAGNOSIS — A4902 Methicillin resistant Staphylococcus aureus infection, unspecified site: Secondary | ICD-10-CM | POA: Diagnosis present

## 2015-07-09 DIAGNOSIS — N611 Abscess of the breast and nipple: Secondary | ICD-10-CM | POA: Diagnosis present

## 2015-07-09 LAB — MRSA PCR SCREENING: MRSA by PCR: POSITIVE — AB

## 2015-07-09 MED ORDER — CHLORHEXIDINE GLUCONATE CLOTH 2 % EX PADS
6.0000 | MEDICATED_PAD | Freq: Every day | CUTANEOUS | Status: DC
  Administered 2015-07-10 – 2015-07-13 (×4): 6 via TOPICAL

## 2015-07-09 MED ORDER — ACETAMINOPHEN 325 MG PO TABS: 650.0000 mg | ORAL_TABLET | Freq: Four times a day (QID) | ORAL | Status: DC | PRN

## 2015-07-09 MED ORDER — PRENATAL MULTIVITAMIN CH
1.0000 | ORAL_TABLET | Freq: Every day | ORAL | Status: DC
  Administered 2015-07-09 – 2015-07-13 (×5): 1 via ORAL
  Filled 2015-07-09 (×5): qty 1

## 2015-07-09 MED ORDER — COMPLETENATE 29-1 MG PO CHEW
1.0000 | CHEWABLE_TABLET | Freq: Every day | ORAL | Status: DC
  Filled 2015-07-09: qty 1

## 2015-07-09 MED ORDER — MUPIROCIN 2 % EX OINT
1.0000 "application " | TOPICAL_OINTMENT | Freq: Two times a day (BID) | CUTANEOUS | Status: DC
  Administered 2015-07-09 – 2015-07-13 (×9): 1 via NASAL
  Filled 2015-07-09: qty 22

## 2015-07-09 MED ORDER — IBUPROFEN 600 MG PO TABS
600.0000 mg | ORAL_TABLET | Freq: Four times a day (QID) | ORAL | Status: DC | PRN
  Administered 2015-07-13: 600 mg via ORAL
  Filled 2015-07-09: qty 1

## 2015-07-09 MED ORDER — OXYCODONE-ACETAMINOPHEN 5-325 MG PO TABS
1.0000 | ORAL_TABLET | ORAL | Status: DC | PRN
  Administered 2015-07-09 – 2015-07-10 (×5): 1 via ORAL
  Administered 2015-07-13 (×2): 2 via ORAL
  Filled 2015-07-09: qty 2
  Filled 2015-07-09 (×3): qty 1
  Filled 2015-07-09: qty 2
  Filled 2015-07-09 (×2): qty 1

## 2015-07-09 NOTE — Progress Notes (Signed)
1 Day Post-Op  Subjective: Sore, but no real complaints.  We are going to leave dressing in until tomorrow.    Objective: Vital signs in last 24 hours: Temp:  [97.9 F (36.6 C)-99.1 F (37.3 C)] 98.2 F (36.8 C) (01/26 0628) Pulse Rate:  [89-100] 100 (01/26 0628) Resp:  [16-23] 17 (01/26 0628) BP: (95-107)/(59-77) 95/65 mmHg (01/26 0628) SpO2:  [93 %-100 %] 96 % (01/26 0628)   240 PO  Voided x 3 Tm 99.1, VSS Admit WBC 15.0  Cultures obtained last PM  Intake/Output from previous day: 01/25 0701 - 01/26 0700 In: 1732.5 [P.O.:240; I.V.:1392.5; IV Piggyback:100] Out: -  Intake/Output this shift:    General appearance: alert, cooperative and no distress Resp: clear to auscultation bilaterally Skin: left breast dressing is dry and intact.  Lab Results:   Recent Labs  07/08/15 1801  WBC 15.0*  HGB 12.3  HCT 38.2  PLT 297    BMET No results for input(s): NA, K, CL, CO2, GLUCOSE, BUN, CREATININE, CALCIUM in the last 72 hours. PT/INR No results for input(s): LABPROT, INR in the last 72 hours.  No results for input(s): AST, ALT, ALKPHOS, BILITOT, PROT, ALBUMIN in the last 168 hours.   Lipase  No results found for: LIPASE   Studies/Results: US Breast Ltd Uni Left Inc Axilla  07/08/2015  CLINICAL DATA:  Recurrent left breast mastitis. Previous left breast drainage in November. Additional left breast drainage/biopsy in December. Patient presented to the ER earlier today and was prescribed additional antibiotics. EXAM: ULTRASOUND OF THE LEFT BREAST COMPARISON:  Previous exam(s). FINDINGS: On physical exam, the skin of the upper and outer left breast is erythematous and firm. Patient is in significant pain. Targeted ultrasound is performed, showing a complex multiloculated collection within the left breast extending from the 12 o'clock axis to the 4 o'clock axis. Largest component is at the 2 o'clock axis, 4 cm from the nipple, measuring approximately 4.9 x 3.1 cm. IMPRESSION:  Complex multiloculated collection, presumably abscess, within the left breast extending from the 12:00 to 4:00 axes. Marked overlying skin erythema. RECOMMENDATION: Surgical management. Patient was immediately escorted by our nurse navigator to [facility]. Findings discussed with Dr. Dwain Sarna at the time of the exam. I have discussed the findings and recommendations with the patient. Results were also provided in writing at the conclusion of the visit. If applicable, a reminder letter will be sent to the patient regarding the next appointment. BI-RADS CATEGORY  2: Benign Finding(s) Electronically Signed   By: Bary Richard M.D.   On: 07/08/2015 15:47    Medications: . enoxaparin (LOVENOX) injection  40 mg Subcutaneous Q24H  . HYDROmorphone      . piperacillin-tazobactam (ZOSYN)  IV  3.375 g Intravenous Q8H   . dextrose 5 % and 0.45 % NaCl with KCl 20 mEq/L 75 mL/hr at 07/08/15 1829   Prior to Admission medications   Medication Sig Start Date End Date Taking? Authorizing Provider  ibuprofen (ADVIL,MOTRIN) 400 MG tablet TAKE ONE TABLET BY MOUTH EVERY 6 HOURS AS NEEDED 05/26/15  Yes Aviva Signs, CNM  Prenat-FeFum-FePo-FA-Omega 3 (CONCEPT DHA) 53.5-38-1 MG CAPS Take 1 tablet by mouth daily. 05/22/15  Yes Peggy Constant, MD  vitamin E 400 UNIT capsule Take 400 Units by mouth daily. Reported on 07/07/2015   Yes Historical Provider, MD  dicloxacillin (DYNAPEN) 250 MG capsule Take 1 capsule (250 mg total) by mouth 4 (four) times daily. 07/07/15 07/17/15  Aviva Signs, CNM  Assessment/Plan Left breast abscess S/p INCISION AND DRAINAGE LEFT BREAST ABSCESS, 07/08/15, Dr. Jimmye Norman Post partum Antibiotics:  Zosyn 1st dose, 07/08/15 DVT: Lovenox starting 1700 today/SCD    Plan:  We will leave packing in today and plan on taking it out tomorrow.  I told her to get up and walk around try pain pills.  Dr. Lindie Spruce says he has a good deal of packing in the wound.  We will take it down  tomorrow.  LOS: 1 day    Lucielle Vokes 07/09/2015

## 2015-07-10 NOTE — Progress Notes (Signed)
Patient ID: Pam Kennedy, female   DOB: March 15, 1981, 35 y.o.   MRN: [id number]     [facility]      [address] [place]   [place], [place] [address]    Phone: [phone] FAX: [phone]     Subjective: Afebrile. Sore. Declined phone interpreter   Objective:  Vital signs:  Filed Vitals:   07/09/15 1259 07/09/15 1730 07/09/15 2241 07/10/15 0705  BP: 83/64 101/72 97/69 94/67   Pulse: 77 90 77 87  Temp: 98.4 F (36.9 C) 98.6 F (37 C) 98.2 F (36.8 C) 98.3 F (36.8 C)  TempSrc: Oral Oral Oral Oral  Resp: Height:      Weight:      SpO2: 98% 97% 98% 96%    Last BM Date:  (PTA)  Intake/Output   Yesterday:  01/26 0701 - 01/27 0700 In: 673 [I.V.:593; IV Piggyback:80] Out: 5 [Urine:5] This shift:    Physical Exam: General: Pt awake/alert/oriented x4 in no acute distress Skin: left breast-2 whole 1/2 inch iodoform removed.  Surrounding cellulitis and induration.  Did not appreciate additional pockets of pus.  1/2 bottle iodoform packed.    Problem List:   Active Problems:   Abscess of left breast   Left breast abscess    Results:   Labs: Results for orders placed or performed during the hospital encounter of 07/08/15 (from the past 48 hour(s))  CBC     Status: Abnormal   Collection Time: 07/08/15  6:01 PM  Result Value Ref Range   WBC 15.0 (H) 4.0 - 10.5 K/uL   RBC 4.31 3.87 - 5.11 MIL/uL   Hemoglobin 12.3 12.0 - 15.0 g/dL   HCT 95.2 84.1 - 32.4 %   MCV 88.6 78.0 - 100.0 fL   MCH 28.5 26.0 - 34.0 pg   MCHC 32.2 30.0 - 36.0 g/dL   RDW 40.1 02.7 - 25.3 %   Platelets 297 150 - 400 K/uL  hCG, serum, qualitative     Status: None   Collection Time: 07/08/15  6:01 PM  Result Value Ref Range   Preg, Serum NEGATIVE NEGATIVE    Comment:        THE SENSITIVITY OF THIS METHODOLOGY IS >10 mIU/mL.   Anaerobic culture     Status: None (Preliminary result)   Collection Time: 07/08/15  9:49 PM  Result Value Ref  Range   Specimen Description ABSCESS LEFT BREAST    Special Requests NONE    Gram Stain      MODERATE WBC PRESENT,BOTH PMN AND MONONUCLEAR NO SQUAMOUS EPITHELIAL CELLS SEEN ABUNDANT GRAM POSITIVE COCCI IN PAIRS IN CLUSTERS Performed at [facility]    Culture PENDING    Report Status PENDING   Culture, routine-abscess     Status: None (Preliminary result)   Collection Time: 07/08/15  9:49 PM  Result Value Ref Range   Specimen Description ABSCESS LEFT BREAST    Special Requests NONE    Gram Stain      MODERATE WBC PRESENT,BOTH PMN AND MONONUCLEAR NO SQUAMOUS EPITHELIAL CELLS SEEN ABUNDANT GRAM POSITIVE COCCI IN PAIRS IN CLUSTERS Performed at [facility]    Culture PENDING    Report Status PENDING   MRSA PCR Screening     Status: Abnormal   Collection Time: 07/09/15 11:00 AM  Result Value Ref Range   MRSA by PCR POSITIVE (A) NEGATIVE    Comment:        The  GeneXpert MRSA Assay (FDA approved for NASAL specimens only), is one component of a comprehensive MRSA colonization surveillance program. It is not intended to diagnose MRSA infection nor to guide or monitor treatment for MRSA infections. RESULT CALLED TO, READ BACK BY AND VERIFIED WITH: P. MOSS RN 13:20 07/09/15 ([email or web address])     Imaging / Studies: US Breast Ltd Uni Left Inc Axilla  07/08/2015  CLINICAL DATA:  Recurrent left breast mastitis. Previous left breast drainage in November. Additional left breast drainage/biopsy in December. Patient presented to the ER earlier today and was prescribed additional antibiotics. EXAM: ULTRASOUND OF THE LEFT BREAST COMPARISON:  Previous exam(s). FINDINGS: On physical exam, the skin of the upper and outer left breast is erythematous and firm. Patient is in significant pain. Targeted ultrasound is performed, showing a complex multiloculated collection within the left breast extending from the 12 o'clock axis to the 4 o'clock axis. Largest component is at the 2 o'clock  axis, 4 cm from the nipple, measuring approximately 4.9 x 3.1 cm. IMPRESSION: Complex multiloculated collection, presumably abscess, within the left breast extending from the 12:00 to 4:00 axes. Marked overlying skin erythema. RECOMMENDATION: Surgical management. Patient was immediately escorted by our nurse navigator to [facility]. Findings discussed with Dr. Dwain Sarna at the time of the exam. I have discussed the findings and recommendations with the patient. Results were also provided in writing at the conclusion of the visit. If applicable, a reminder letter will be sent to the patient regarding the next appointment. BI-RADS CATEGORY  2: Benign Finding(s) Electronically Signed   By: Bary Richard M.D.   On: 07/08/2015 15:47    Medications / Allergies:  Scheduled Meds: . Chlorhexidine Gluconate Cloth  6 each Topical Q0600  . enoxaparin (LOVENOX) injection  40 mg Subcutaneous Q24H  . mupirocin ointment  1 application Nasal BID  . piperacillin-tazobactam (ZOSYN)  IV  3.375 g Intravenous Q8H  . prenatal multivitamin  1 tablet Oral Q1200   Continuous Infusions: . dextrose 5 % and 0.45 % NaCl with KCl 20 mEq/L 75 mL/hr at 07/10/15 0522   PRN Meds:.acetaminophen, HYDROmorphone (DILAUDID) injection, ibuprofen, ondansetron **OR** ondansetron (ZOFRAN) IV, oxyCODONE-acetaminophen  Antibiotics: Anti-infectives    Start     Dose/Rate Route Frequency Ordered Stop   07/08/15 1800  piperacillin-tazobactam (ZOSYN) IVPB 3.375 g     3.375 g 12.5 mL/hr over 240 Minutes Intravenous Every 8 hours 07/08/15 1739          Assessment/Plan POD#2 I&D left breast abscess---Dr. Lindie Spruce -surrounding cellulitis, continue with IV antibiotics -start BID wet to dry dressing changes, iodoform for now -encourage PO pain meds with dressing changes Post partum, 4 months VTE prophylaxis-SCD/lovenox FEN-no issues Dispo-cellulitis, dressing changes  Ashok Norris, ANP-BC [facility] Pager  [phone](7A-4:30P) For consults and floor pages call [phone](7A-4:30P)  07/10/2015 7:59 AM

## 2015-07-11 LAB — CULTURE, ROUTINE-ABSCESS

## 2015-07-11 MED ORDER — VANCOMYCIN HCL 10 G IV SOLR
1500.0000 mg | Freq: Once | INTRAVENOUS | Status: DC
  Filled 2015-07-11: qty 1500

## 2015-07-11 MED ORDER — VANCOMYCIN HCL 10 G IV SOLR
1500.0000 mg | Freq: Once | INTRAVENOUS | Status: AC
  Administered 2015-07-11: 1500 mg via INTRAVENOUS
  Filled 2015-07-11 (×2): qty 1500

## 2015-07-11 MED ORDER — VANCOMYCIN HCL IN DEXTROSE 1-5 GM/200ML-% IV SOLN
1000.0000 mg | Freq: Two times a day (BID) | INTRAVENOUS | Status: DC
  Administered 2015-07-12: 1000 mg via INTRAVENOUS
  Filled 2015-07-11 (×2): qty 200

## 2015-07-11 NOTE — Progress Notes (Signed)
ANTIBIOTIC CONSULT NOTE - INITIAL  Pharmacy Consult for Vancomycin Indication: MRSA breast cellulitis   No Known Allergies  Patient Measurements: Height: 5' (152.4 cm) Weight: 135 lb 14.4 oz (61.644 kg) IBW/kg (Calculated) : 45.5  Vital Signs: Temp: 98.6 F (37 C) (01/28 0627) Temp Source: Oral (01/28 0627) BP: 107/74 mmHg (01/28 0627) Pulse Rate: 85 (01/28 0627) Intake/Output from previous day: 01/27 0701 - 01/28 0700 In: 3076.3 [P.O.:1160; I.V.:1766.3; IV Piggyback:150] Out: -  Intake/Output from this shift:    Labs:  Recent Labs  07/08/15 1801  WBC 15.0*  HGB 12.3  PLT 297   CrCl cannot be calculated (Patient has no serum creatinine result on file.). No results for input(s): VANCOTROUGH, VANCOPEAK, VANCORANDOM, GENTTROUGH, GENTPEAK, GENTRANDOM, TOBRATROUGH, TOBRAPEAK, TOBRARND, AMIKACINPEAK, AMIKACINTROU, AMIKACIN in the last 72 hours.   Microbiology: Recent Results (from the past 720 hour(s))  Anaerobic culture     Status: None (Preliminary result)   Collection Time: 07/08/15  9:49 PM  Result Value Ref Range Status   Specimen Description ABSCESS LEFT BREAST  Final   Special Requests NONE  Final   Gram Stain   Final    MODERATE WBC PRESENT,BOTH PMN AND MONONUCLEAR NO SQUAMOUS EPITHELIAL CELLS SEEN ABUNDANT GRAM POSITIVE COCCI IN PAIRS IN CLUSTERS Performed at [facility]    Culture   Final    NO ANAEROBES ISOLATED; CULTURE IN PROGRESS FOR 5 DAYS Performed at [facility]    Report Status PENDING  Incomplete  Culture, routine-abscess     Status: None   Collection Time: 07/08/15  9:49 PM  Result Value Ref Range Status   Specimen Description ABSCESS LEFT BREAST  Final   Special Requests NONE  Final   Gram Stain   Final    MODERATE WBC PRESENT,BOTH PMN AND MONONUCLEAR NO SQUAMOUS EPITHELIAL CELLS SEEN ABUNDANT GRAM POSITIVE COCCI IN PAIRS IN CLUSTERS Performed at [facility]    Culture   Final    ABUNDANT METHICILLIN  RESISTANT STAPHYLOCOCCUS AUREUS Note: RIFAMPIN AND GENTAMICIN SHOULD NOT BE USED AS SINGLE DRUGS FOR TREATMENT OF STAPH INFECTIONS. This organism DOES NOT demonstrate inducible Clindamycin resistance in vitro. CRITICAL RESULT CALLED TO, READ BACK BY AND VERIFIED WITH: ZENADI C 1/28   BY REAMM Performed at [facility]    Report Status 07/11/2015 FINAL  Final   Organism ID, Bacteria METHICILLIN RESISTANT STAPHYLOCOCCUS AUREUS  Final      Susceptibility   Methicillin resistant staphylococcus aureus - MIC*    CLINDAMYCIN <=0.25 SENSITIVE Sensitive     ERYTHROMYCIN >=8 RESISTANT Resistant     GENTAMICIN <=0.5 SENSITIVE Sensitive     LEVOFLOXACIN 0.25 SENSITIVE Sensitive     OXACILLIN >=4 RESISTANT Resistant     RIFAMPIN <=0.5 SENSITIVE Sensitive     TRIMETH/SULFA <=10 SENSITIVE Sensitive     VANCOMYCIN 1 SENSITIVE Sensitive     TETRACYCLINE <=1 SENSITIVE Sensitive     * ABUNDANT METHICILLIN RESISTANT STAPHYLOCOCCUS AUREUS  MRSA PCR Screening     Status: Abnormal   Collection Time: 07/09/15 11:00 AM  Result Value Ref Range Status   MRSA by PCR POSITIVE (A) NEGATIVE Final    Comment:        The GeneXpert MRSA Assay (FDA approved for NASAL specimens only), is one component of a comprehensive MRSA colonization surveillance program. It is not intended to diagnose MRSA infection nor to guide or monitor treatment for MRSA infections. RESULT CALLED TO, READ BACK BY AND VERIFIED WITH: Demetrius Charity MOSS RN 13:20  07/09/15 ([email or web address])     Medical History: Past Medical History  Diagnosis Date  . Abscess of left breast hospitalized 07/08/2015    "problems off and on w/abscess since November 2016; [facility]" (07/08/2015)    Medications:  Prescriptions prior to admission  Medication Sig Dispense Refill Last Dose  . ibuprofen (ADVIL,MOTRIN) 400 MG tablet TAKE ONE TABLET BY MOUTH EVERY 6 HOURS AS NEEDED 30 tablet 0 07/07/2015 at Unknown time  .  Prenat-FeFum-FePo-FA-Omega 3 (CONCEPT DHA) 53.5-38-1 MG CAPS Take 1 tablet by mouth daily. 30 capsule 2 07/07/2015 at Unknown time  . vitamin E 400 UNIT capsule Take 400 Units by mouth daily. Reported on 07/07/2015   07/07/2015 at Unknown time  . dicloxacillin (DYNAPEN) 250 MG capsule Take 1 capsule (250 mg total) by mouth 4 (four) times daily. 40 capsule 0 not started yet   Assessment: 35 yo F presents on 1/24 with L breast abscess and recurrent mastitis. Currently on Zosyn for abscess. Abscess culture grew out MRSA today. Adding Vancomycin. Afebrile, WBC 11.1 (last month). SCr in December was stable. CrCl ~19ml/min  Goal of Therapy:  Vancomycin trough level 10-15 mcg/ml  Plan:  -Give Vanomycin load of 1500 mg IV once followed by Vanc 1 gm IV Q 12 hours  -Continue Zosyn 3.375 gm IV q8h (4 hour infusion) -Monitor clinical picture, renal function -Monitor renal fx and clinical progress -[facility] at [facility]   Vinnie Level, PharmD., BCPS Clinical Pharmacist Pager [phone]

## 2015-07-11 NOTE — Progress Notes (Signed)
3 Days Post-Op  Subjective: Slowly getting better.  Afebrile.  Still has pain in left breast but less. Also complains of decreasing ability to pump milk from right breast and it is becoming engorged and painful. I have put in a consult for the lactation specialist help. I do not know whether she will have to discontinue breast-feeding or not  Breast abscess growing MRSA.  I have ordered vancomycin to be dosed by pharmacy.  Objective: Vital signs in last 24 hours: Temp:  [98.2 F (36.8 C)-98.6 F (37 C)] 98.6 F (37 C) (01/28 0627) Pulse Rate:  [80-85] 85 (01/28 0627) Resp:  [17-19] 17 (01/28 0627) BP: (96-107)/(74-77) 107/74 mmHg (01/28 0627) SpO2:  [98 %-99 %] 99 % (01/28 0627) Last BM Date:  (PTA)  Intake/Output from previous day: 01/27 0701 - 01/28 0700 In: 3076.3 [P.O.:1160; I.V.:1766.3; IV Piggyback:150] Out: -  Intake/Output this shift:    General appearance: Alert.  Minimal distress.  Some language barrier.  Skin warm and dry. Breasts: , Breasts are lactating.  Some milk at right nipple.  Lateral right breast firm and a little tender.  Left breast has cellulitis superiolaterally.  Packing removed.  No purulence.  Repacked.  Lab Results:  No results found for this or any previous visit (from the past 24 hour(s)).   Studies/Results: No results found.  . Chlorhexidine Gluconate Cloth  6 each Topical Q0600  . enoxaparin (LOVENOX) injection  40 mg Subcutaneous Q24H  . mupirocin ointment  1 application Nasal BID  . piperacillin-tazobactam (ZOSYN)  IV  3.375 g Intravenous Q8H  . prenatal multivitamin  1 tablet Oral Q1200     Assessment/Plan: s/p Procedure(s): INCISION AND DRAINAGE LEFT BREAST ABSCESS POD#3 I&D left breast abscess---Dr. Lindie Spruce -surrounding cellulitis, continue with IV antibiotics Ad vancomycin.  MRSA positive. - BID wet to dry dressing changes, iodoform for now -encourage PO pain meds with dressing changes Post partum, 4 months Problems with  lactation right breast.  Consult put in for lactation specialist to see her. VTE prophylaxis-SCD/lovenox FEN-no issues Dispo-cellulitis, dressing changes   @  LOS: 3 days    Pam Kennedy 07/11/2015  . .prob

## 2015-07-12 LAB — BASIC METABOLIC PANEL
ANION GAP: 9 (ref 5–15)
BUN: 6 mg/dL (ref 6–20)
CALCIUM: 9.4 mg/dL (ref 8.9–10.3)
CO2: 29 mmol/L (ref 22–32)
CREATININE: 1.08 mg/dL — AB (ref 0.44–1.00)
Chloride: 102 mmol/L (ref 101–111)
GFR calc non Af Amer: >60 mL/min (ref >60)
Glucose, Bld: 91 mg/dL (ref 65–99)
Potassium: 3.8 mmol/L (ref 3.5–5.1)
SODIUM: 140 mmol/L (ref 135–145)

## 2015-07-12 MED ORDER — VANCOMYCIN HCL IN DEXTROSE 750-5 MG/150ML-% IV SOLN
750.0000 mg | Freq: Two times a day (BID) | INTRAVENOUS | Status: DC
  Administered 2015-07-13: 750 mg via INTRAVENOUS
  Filled 2015-07-12 (×2): qty 150

## 2015-07-12 NOTE — Progress Notes (Signed)
Pharmacy Antibiotic Follow-up Note  Pam Kennedy is a 35 y.o. year-old female admitted on 07/08/2015.  The patient is currently on day 2 of Vancomycin and day 3 of Zosyn for left breast abscess.  Assessment/ Breast ultrasound showed complex fluid collection. S/p I&D of left breast abscess on 1/25. Wound culture growing MRSA.  Plan: -Decrease Vancomycin to 750 mg IV Q 12 hours  -Continue Zosyn 3.375 gm IV q8h (4 hour infusion) -Monitor clinical picture, renal function -Monitor renal fx and clinical progress -Likely home on oral abx in 1-2 days. Holding off on VT for today  Temp (24hrs), Avg:98.4 F (36.9 C), Min:98.1 F (36.7 C), Max:98.6 F (37 C)   Recent Labs Lab 07/08/15 1801  WBC 15.0*    Recent Labs Lab 07/12/15 0611  CREATININE 1.08*   Estimated Creatinine Clearance: 60.7 mL/min (by C-G formula based on Cr of 1.08).    No Known Allergies  Antimicrobials this admission: 1/26 Zosyn>>  1/28 Vanc>>  Levels/dose changes this admission: None   Microbiology results: 1/25 Abscess>> MRSA (S to bactrim, Vanc, tetracycline)  Thank you for allowing pharmacy to be a part of this patient's care.  Vinnie Level, PharmD., BCPS Clinical Pharmacist Phone [phone]

## 2015-07-12 NOTE — Progress Notes (Signed)
4 Days Post-Op  Subjective: Continues to slowly improve.  Afebrile.  Bilateral breast pain less. Twice a day dressing changes per nursing staff are going well. Has not been seen by lactation specialist yet.  On vancomycin and Zosyn for MRSA infection left breast with cellulitis and abscess  Objective: Vital signs in last 24 hours: Temp:  [98.1 F (36.7 C)-98.6 F (37 C)] 98.6 F (37 C) (01/29 0555) Pulse Rate:  [74-91] 83 (01/29 0555) Resp:  [16-17] 16 (01/29 0555) BP: (99-110)/(70-78) 99/72 mmHg (01/29 0555) SpO2:  [97 %-100 %] 97 % (01/29 0555) Last BM Date: 07/11/15  Intake/Output from previous day: 01/28 0701 - 01/29 0700 In: 1650 [P.O.:800; I.V.:600; IV Piggyback:250] Out: -  Intake/Output this shift:    General appearance: Alert.  Looks more comfortable.  Mental status normal Breasts:  Both left and right breast are softer and less tender.  Cellulitis settling down slowly on left side.  No evidence of infection or saline is on right.  Right breast is lactating.  Lab Results:  Results for orders placed or performed during the hospital encounter of 07/08/15 (from the past 24 hour(s))  Basic metabolic panel     Status: Abnormal   Collection Time: 07/12/15  6:11 AM  Result Value Ref Range   Sodium 140 135 - 145 mmol/L   Potassium 3.8 3.5 - 5.1 mmol/L   Chloride 102 101 - 111 mmol/L   CO2 29 22 - 32 mmol/L   Glucose, Bld 91 65 - 99 mg/dL   BUN 6 6 - 20 mg/dL   Creatinine, Ser 4.09 (H) 0.44 - 1.00 mg/dL   Calcium 9.4 8.9 - 81.1 mg/dL   GFR calc non Af Amer >60 >60 mL/min   GFR calc Af Amer >60 >60 mL/min   Anion gap 9 5 - 15     Studies/Results: No results found.  . Chlorhexidine Gluconate Cloth  6 each Topical Q0600  . enoxaparin (LOVENOX) injection  40 mg Subcutaneous Q24H  . mupirocin ointment  1 application Nasal BID  . piperacillin-tazobactam (ZOSYN)  IV  3.375 g Intravenous Q8H  . prenatal multivitamin  1 tablet Oral Q1200  . vancomycin  1,000 mg  Intravenous Q12H     Assessment/Plan: s/p Procedure(s): INCISION AND DRAINAGE LEFT BREAST ABSCESS  POD#4 I&D left breast abscess---Dr. Lindie Spruce -surrounding cellulitis, continue with IV antibiotics Add vancomycin. MRSA positive. - BID wet to dry dressing changes, iodoform for now -encourage PO pain meds with dressing changes -Hopefully home on Bactrim or doxycycline in a day or 2 Post partum, 4 months Problems with lactation right breast. Consult put in for lactation specialist to see her. VTE prophylaxis-SCD/lovenox FEN-no issues Dispo-cellulitis, dressing changes  @  LOS: 4 days    Ellington Cornia M 07/12/2015  . .prob

## 2015-07-13 LAB — ANAEROBIC CULTURE

## 2015-07-13 LAB — CBC
HEMATOCRIT: 35.3 % — AB (ref 36.0–46.0)
Hemoglobin: 11.4 g/dL — ABNORMAL LOW (ref 12.0–15.0)
MCH: 28.6 pg (ref 26.0–34.0)
MCHC: 32.3 g/dL (ref 30.0–36.0)
MCV: 88.7 fL (ref 78.0–100.0)
PLATELETS: 360 10*3/uL (ref 150–400)
RBC: 3.98 MIL/uL (ref 3.87–5.11)
RDW: 15.2 % (ref 11.5–15.5)
WBC: 9 10*3/uL (ref 4.0–10.5)

## 2015-07-13 MED ORDER — ACETAMINOPHEN 325 MG PO TABS: 650.0000 mg | ORAL_TABLET | Freq: Four times a day (QID) | ORAL | Status: AC | PRN

## 2015-07-13 MED ORDER — SACCHAROMYCES BOULARDII 250 MG PO CAPS
250.0000 mg | ORAL_CAPSULE | Freq: Two times a day (BID) | ORAL | Status: DC
  Administered 2015-07-13: 250 mg via ORAL
  Filled 2015-07-13: qty 1

## 2015-07-13 MED ORDER — OXYCODONE-ACETAMINOPHEN 5-325 MG PO TABS: 1.0000 | ORAL_TABLET | ORAL | Status: AC | PRN

## 2015-07-13 MED ORDER — MUPIROCIN 2 % EX OINT: 1.0000 "application " | TOPICAL_OINTMENT | Freq: Two times a day (BID) | CUTANEOUS | Status: AC

## 2015-07-13 MED ORDER — IBUPROFEN 200 MG PO TABS: ORAL_TABLET | ORAL | Status: AC

## 2015-07-13 MED ORDER — SULFAMETHOXAZOLE-TRIMETHOPRIM 800-160 MG PO TABS: 1.0000 | ORAL_TABLET | Freq: Two times a day (BID) | ORAL | Status: AC

## 2015-07-13 MED ORDER — SACCHAROMYCES BOULARDII 250 MG PO CAPS: ORAL_CAPSULE | ORAL | Status: AC

## 2015-07-13 MED ORDER — SULFAMETHOXAZOLE-TRIMETHOPRIM 800-160 MG PO TABS
1.0000 | ORAL_TABLET | Freq: Two times a day (BID) | ORAL | Status: DC
  Administered 2015-07-13: 1 via ORAL
  Filled 2015-07-13: qty 1

## 2015-07-13 NOTE — Care Management Note (Signed)
Case Management Note  Patient Details  Name: Pam Kennedy MRN: [id number] Date of Birth: 06/21/1980  Subjective/Objective:                    Action/Plan:  Confirmed face sheet information with patient's husband Molly Maduro Rorrer at bedside.  He will assist patient with dressing changes along with home health  Expected Discharge Date:                  Expected Discharge Plan:  Home w Home Health Services  In-House Referral:     Discharge planning Services  CM Consult  Post Acute Care Choice:  Home Health Choice offered to:  Spouse, Patient  DME Arranged:    DME Agency:     HH Arranged:  RN HH Agency:  [facility]  Status of Service:  Completed, signed off  Medicare Important Message Given:    Date Medicare IM Given:    Medicare IM give by:    Date Additional Medicare IM Given:    Additional Medicare Important Message give by:     If discussed at Long Length of Stay Meetings, dates discussed:    Additional Comments:  Kingsley Plan, RN 07/13/2015, 11:23 AM

## 2015-07-13 NOTE — Progress Notes (Signed)
Patient and spouse demonstrated dressing change. Supplies send home with patient.   Pam Kennedy to be D/C'd  per MD order. Discussed with the patient and all questions fully answered.  VSS, Skin clean, dry and intact without evidence of skin break down, no evidence of skin tears noted.  IV catheter discontinued intact. Site without signs and symptoms of complications. Dressing and pressure applied.  An After Visit Summary was printed and given to the patient. Patient received prescription.  D/c education completed with patient/family including follow up instructions, medication list, d/c activities limitations if indicated, with other d/c instructions as indicated by MD - patient able to verbalize understanding, all questions fully answered.   Patient instructed to return to ED, call 911, or call MD for any changes in condition.   Patient to be escorted via WC, and D/C home via private auto.

## 2015-07-13 NOTE — Progress Notes (Signed)
5 Days Post-Op  Subjective: Asking about going home, but still taking IV pain meds for dressing change.  Also awaiting lactation nurse to see her about the other breast. We did the dressing  Change with just PO pain meds.  I showed her husband how to do the dressing change, but he has not done it yet.  Still waiting on lactation consultant. Objective: Vital signs in last 24 hours: Temp:  [98.2 F (36.8 C)-98.4 F (36.9 C)] 98.2 F (36.8 C) (01/30 0554) Pulse Rate:  [74-84] 81 (01/30 0554) Resp:  [16-17] 17 (01/30 0554) BP: (100-105)/(63-70) 101/63 mmHg (01/30 0554) SpO2:  [98 %-99 %] 98 % (01/30 0554) Last BM Date: 07/11/15 462 PO recorded, regular diet Voided x 13 Afebrile, VSS No labs today Creatinine was l.08 yesterday Breast Culture:  Culture & Susceptibility      METHICILLIN RESISTANT STAPHYLOCOCCUS AUREUS     Antibiotic Sensitivity Microscan Status    CLINDAMYCIN Sensitive <=0.25 SENSITIVE Final    Method: MIC    ERYTHROMYCIN Resistant >=8 RESISTANT Final    Method: MIC    GENTAMICIN Sensitive <=0.5 SENSITIVE Final    Method: MIC    LEVOFLOXACIN Sensitive 0.25 SENSITIVE Final    Method: MIC    OXACILLIN Resistant >=4 RESISTANT Final    Method: MIC    RIFAMPIN Sensitive <=0.5 SENSITIVE Final    Method: MIC    TETRACYCLINE Sensitive <=1 SENSITIVE Final    Method: MIC    TRIMETH/SULFA Sensitive <=10 SENSITIVE Final    Method: MIC    VANCOMYCIN Sensitive 1 SENSITIVE Final    Method: MIC    Comments METHICILLIN RESISTANT STAPHYLOCOCCUS AUREUS (MIC)    ABUNDANT METHICILLIN RESISTANT STAPHYLOCOCCUS AUREUS                  Intake/Output from previous day: 01/29 0701 - 01/30 0700 In: 2585.8 [P.O.:462; I.V.:1973.8; IV Piggyback:150] Out: -  Intake/Output this shift:    General appearance: alert, cooperative and no distress Breasts: 0  Lab Results:  No results for input(s): WBC, HGB, HCT, PLT in the last 72  hours.  BMET  Recent Labs  07/12/15 0611  NA 140  K 3.8  CL 102  CO2 29  GLUCOSE 91  BUN 6  CREATININE 1.08*  CALCIUM 9.4   PT/INR No results for input(s): LABPROT, INR in the last 72 hours.  No results for input(s): AST, ALT, ALKPHOS, BILITOT, PROT, ALBUMIN in the last 168 hours.   Lipase  No results found for: LIPASE   Studies/Results: No results found.  Medications: . Chlorhexidine Gluconate Cloth  6 each Topical Q0600  . enoxaparin (LOVENOX) injection  40 mg Subcutaneous Q24H  . mupirocin ointment  1 application Nasal BID  . piperacillin-tazobactam (ZOSYN)  IV  3.375 g Intravenous Q8H  . prenatal multivitamin  1 tablet Oral Q1200  . vancomycin  750 mg Intravenous Q12H   . dextrose 5 % and 0.45 % NaCl with KCl 20 mEq/L 75 mL (07/12/15 1809)   Assessment/Plan Left breast abscess S/p INCISION AND DRAINAGE LEFT BREAST ABSCESS, 07/08/15, Pam Kennedy Post partum Antibiotics: Zosyn day 6, day 3 vancomycin DVT: Lovenox/SCD  Plan:  I will try her dressing change with PO meds this AM, and see how she does, decrease IV fluids and check on lactation nurse.    LOS: 5 days    Pam Kennedy 07/13/2015

## 2015-07-13 NOTE — Discharge Summary (Signed)
Physician Discharge Summary  Patient ID: Pam Kennedy MRN: [id number] DOB/AGE: Feb 05, 1981 35 y.o.  Admit date: 07/08/2015 Discharge date: 07/13/2015  Admission Diagnoses:  Left breast abscess Post partum  Discharge Diagnoses:  Left breast Abscess with MRSA Post partum  Active Problems:   Abscess of left breast   Left breast abscess   PROCEDURES: INCISION AND DRAINAGE LEFT BREAST ABSCESS, 07/13/15, Dr. Vergia Alberts Course:  Pam Kennedy is a 35 year old patient who is referred to [place] by the breast center for evaluation of a left breast abscess. The patient is approximately 3-1/2 months postpartum who had been seen by her obstetrician today for recurrent pain and swelling to the left breast. She had been on antibiotics previously for similar presentation. Her first bout of this started in November, 1 month following delivery of her child. She had also been seen at the breast center for aspiration of a galactocele. The patient says that she has never completely recovered from any of these episodes. After being seen in obstetrics this morning, she was referred to the breast center for imaging. Breast ultrasound revealed a complex fluid collection on the lateral left breast extending from 12 to 4:00, the greatest collection measuring approximately 5 cm. The patient has been breast feeding, mostly on the right, but notes that there has been a small amount of milk from the left-hand side. The patient denies fever.  She was admitted from our office and was taken to the OR later the evening of admission for her left breast abscess.  She had a very significant abscess and the site was packed with 2 bottles of Iodoform Nugauze packing.  We left the dressing in place for the 1st 24 hours and then started repacking site with 1/2 inch iodoform gauze.  Pt site is much better, and by today she is tolerating the dressing change with just oral pain medicines.  We have requested lactation consultant over  the weekend and today.  I have told her husband to get her an appointment at their clinic and if they do not see her here she can follow up as an outpatient.  We plan to send her home today with packings and we will have her follow up in our clinic and the lactation clinic.  We are asking home health to see her and help with dressing changes also.  We plan to let her shower before supper, remove dressing and have her husband do the dressing change before she goes home tonight.    Condition on d/c:  Improved      Disposition: 01-Home or Self Care      Discharge Instructions    Change dressing    Complete by:  As directed   You can shower and remove the packing in the shower.  You can get soap and water into the open site.  Repack with 1/2 inch Iodoform gauze twice a day till it heals in.  It should take less packing as it heals in.            Medication List    STOP taking these medications        dicloxacillin 250 MG capsule  Commonly known as:  DYNAPEN      TAKE these medications        acetaminophen 325 MG tablet  Commonly known as:  TYLENOL  Take 2 tablets (650 mg total) by mouth every 6 (six) hours as needed for mild pain, moderate pain, fever or headache.  CONCEPT DHA 53.5-38-1 MG Caps  Take 1 tablet by mouth daily.     ibuprofen 200 MG tablet  Commonly known as:  ADVIL,MOTRIN  You may take 2-3 tablets every 6 hours as needed for pain.     mupirocin ointment 2 %  Commonly known as:  BACTROBAN  Place 1 application into the nose 2 (two) times daily.  Finish the tube from the hospital.     oxyCODONE-acetaminophen 5-325 MG tablet  Commonly known as:  PERCOCET/ROXICET  Take 1-2 tablets by mouth every 4 (four) hours as needed for moderate pain.     saccharomyces boulardii 250 MG capsule  Commonly known as:  FLORASTOR  You can buy this over the counter and take for at least one weeks after you finish course of antibiotics     sulfamethoxazole-trimethoprim 800-160  MG tablet  Commonly known as:  BACTRIM DS,SEPTRA DS  Take 1 tablet by mouth every 12 (twelve) hours.     vitamin E 400 UNIT capsule  Take 400 Units by mouth daily. Reported on 07/07/2015       Follow-up Information    Follow up with [facility].   Contact information:   [address] [place] [address] [phone]       Follow up with [facility].   Specialty:  Lactation   Why:  Call and make a follow up appointment if they have not called you.  You can resume lactation when you are off all pain medicines, off all antibiotics, and the lactation clinic says it is reasonable to resume .     Contact information:   [address] [id number] mc [place] [address] [phone]      Follow up with [facility] On 08/05/2015.   Specialty:  General Surgery   Why:  You have an appointment at 10:20 AM, be there 30 minutes early for check in.   Contact information:   [address] [place] [address] [phone]       Signed: Sherrie George 07/13/2015, 1:30 PM

## 2015-07-13 NOTE — Lactation Note (Signed)
Lactation Consultation Note  Assessment of patient post  I&D of the left breast related to an abscess.  Her incision enters the areolar complex.  It is about 1cm from the nipple.  Left breast has some fullness so patient was shown how to hand express to relieve pressure.  Patient will not be able to use a breast pump on this breast because of the location of the incision.  This area is healing with the use of iodoform gauze.  Patient desires to stop milk production on the left breast.  Plan is to continue supply on the right. Encouraged pumping for 15-20" every 2 hours during the day and every 4 hours at night. Also encouraged hand expression.  Suspect that if she increases pumping she will increase her supply.  Discussed medications she is being sent home on. Per [place] Slider they are an L3 and are safe for short term use in a healthy infant.  Patient is currently producing about 1/2 of the milk her baby needs but desires to dump her milk until she is off medication. Encouraged follow-up as an outpatient as needed.  Patient Name: Pam Kennedy [id number] Date: 07/13/2015     Maternal Data    Feeding    LATCH Score/Interventions                      Lactation Tools Discussed/Used     Consult Status      Pam, Kennedy 07/13/2015, 4:27 PM

## 2015-07-13 NOTE — Discharge Instructions (Signed)
Abscess An abscess (boil or furuncle) is an infected area on or under the skin. This area is filled with yellowish-white fluid (pus) and other material (debris). HOME CARE   Only take medicines as told by your doctor.  If you were given antibiotic medicine, take it as directed. Finish the medicine even if you start to feel better.  If gauze is used, follow your doctor's directions for changing the gauze.  To avoid spreading the infection:  Keep your abscess covered with a bandage.  Wash your hands well.  Do not share personal care items, towels, or whirlpools with others.  Avoid skin contact with others.  Keep your skin and clothes clean around the abscess.  Keep all doctor visits as told. GET HELP RIGHT AWAY IF:   You have more pain, puffiness (swelling), or redness in the wound site.  You have more fluid or blood coming from the wound site.  You have muscle aches, chills, or you feel sick.  You have a fever. MAKE SURE YOU:   Understand these instructions.  Will watch your condition.  Will get help right away if you are not doing well or get worse.   This information is not intended to replace advice given to you by your health care provider. Make sure you discuss any questions you have with your health care provider.   Document Released: 11/16/2007 Document Revised: 11/29/2011 Document Reviewed: 08/13/2011 Elsevier Interactive Patient Education 2016 [employer]  Dressing Change A dressing is a material placed over wounds. It keeps the wound clean, dry, and protected from further injury.  BEFORE YOU BEGIN  Get your supplies together. Things you may need include:  Salt solution (saline).  Iodoform 1/2 inch gauze  sisscors    Tape.  Gloves.  Gauze squares.  Plastic bags.  Take pain medicine 30 minutes before the bandage change if you need it.  Take a shower before you do the first bandage change of the day. Put plastic wrap or a bag over the  dressing. REMOVING YOUR OLD BANDAGE, you can then shower and take out packing in the shower.  Wash your hands with soap and water. Dry your hands with a clean towel.  Put on your gloves.  Remove any tape.  Remove the old bandage as told. If it sticks, put a small amount of warm water on it to loosen the bandage.  Remove any gauze or packing tape in your wound.  Take off your gloves.  Put the gloves, tape, gauze, or any packing tape in a plastic bag. CHANGING YOUR BANDAGE  Open the supplies.  Take the cap off the salt solution.  Open the gauze. Leave the gauze on the inside of the package.  Put on your gloves.  Clean your wound as told by your doctor.  Keep your wound dry if your doctor told you to do so.  Your doctor may tell you to do one or more of the following:  Pick up the gauze. Pour the salt solution over the gauze. Squeeze out the extra salt solution.  Put medicated cream or other medicine on your wound.  Put solution soaked gauze only in your wound, not on the skin around it.  Pack your wound loosely.  Put dry gauze on your wound.  Put belly pads over the dry gauze if your bandages soak through.  Tape the bandage in place so it will not fall off. Do not wrap the tape all the way around your arm or leg.  Wrap the bandage with the flexible gauze bandage as told by your doctor.  Take off your gloves. Put them in the plastic bag with the old bandage. Tie the bag shut and throw it away.  Keep the bandage clean and dry.  Wash your hands. GET HELP RIGHT AWAY IF:   Your skin around the wound looks red.  Your wound feels more tender or sore.  You see yellowish-white fluid (pus) in the wound.  Your wound smells bad.  You have a fever.  Your skin around the wound has a red rash that itches and burns.  You see black or yellow skin in your wound that was not there before.  You feel sick to your stomach (nauseous), throw up (vomit), and feel very  tired.   This information is not intended to replace advice given to you by your health care provider. Make sure you discuss any questions you have with your health care provider.   Document Released: 08/26/2008 Document Revised: 06/20/2014 Document Reviewed: 04/10/2011 Elsevier Interactive Patient Education [employer]

## 2016-02-02 ENCOUNTER — Other Ambulatory Visit: Payer: Self-pay | Admitting: Family Medicine

## 2016-02-02 DIAGNOSIS — N644 Mastodynia: Secondary | ICD-10-CM

## 2016-02-04 ENCOUNTER — Ambulatory Visit
Admission: RE | Admit: 2016-02-04 | Discharge: 2016-02-04 | Disposition: A | Discharge status: Home / Self Care | Payer: BLUE CROSS/BLUE SHIELD | Source: Ambulatory Visit | Attending: Family Medicine | Admitting: Family Medicine

## 2016-02-04 DIAGNOSIS — N644 Mastodynia: Secondary | ICD-10-CM

## 2016-11-19 IMAGING — US US OB FOLLOW-UP
3 series · 12 of 28 positions shown · non-contrast
Comparison: none

[Series 1: us ob follow up · 8 of 52 slices shown (1 of 3)]
[im 3/52]
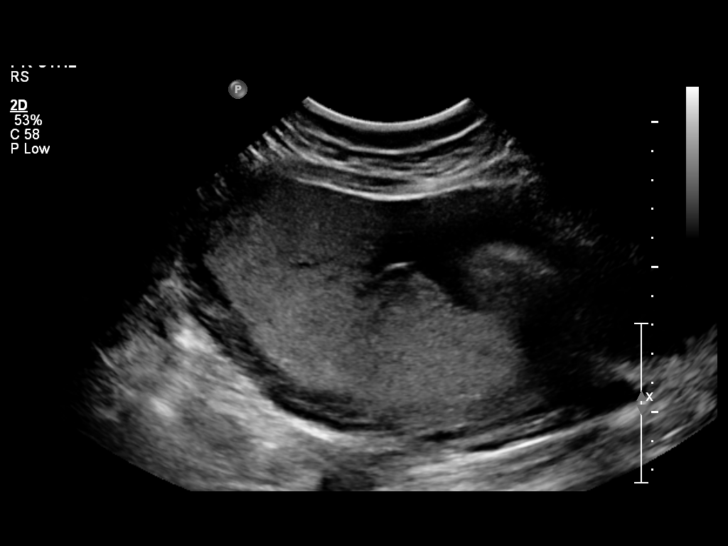
[im 9/52]
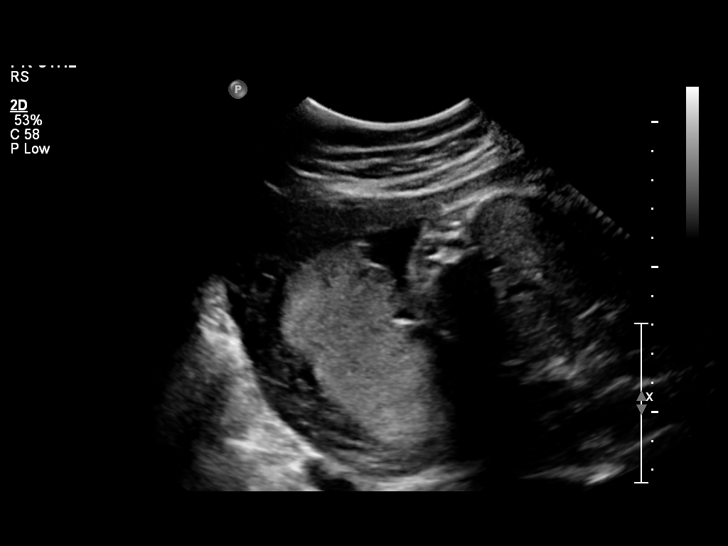
[im 15/52]
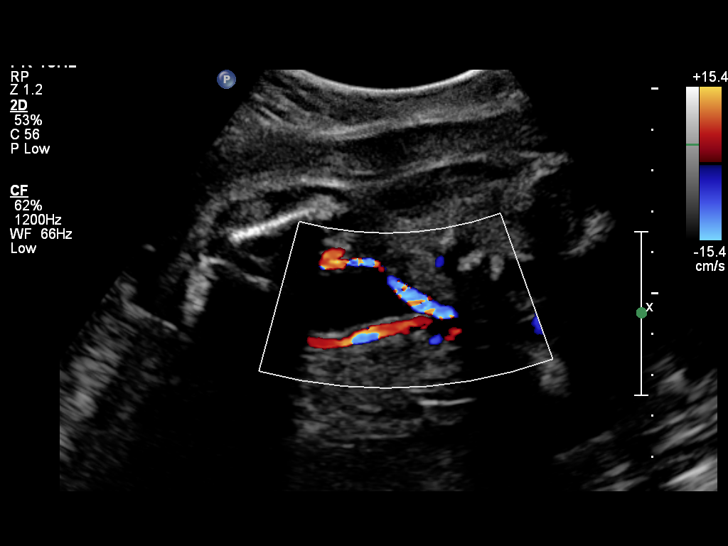
[im 23/52]
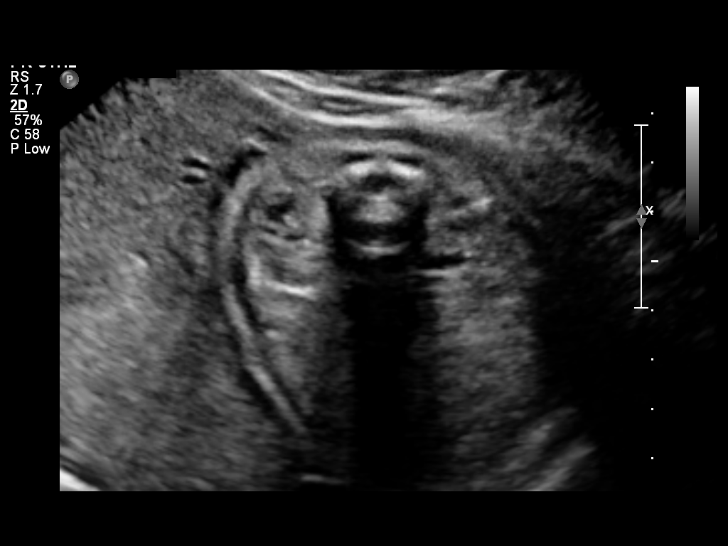
[im 29/52]
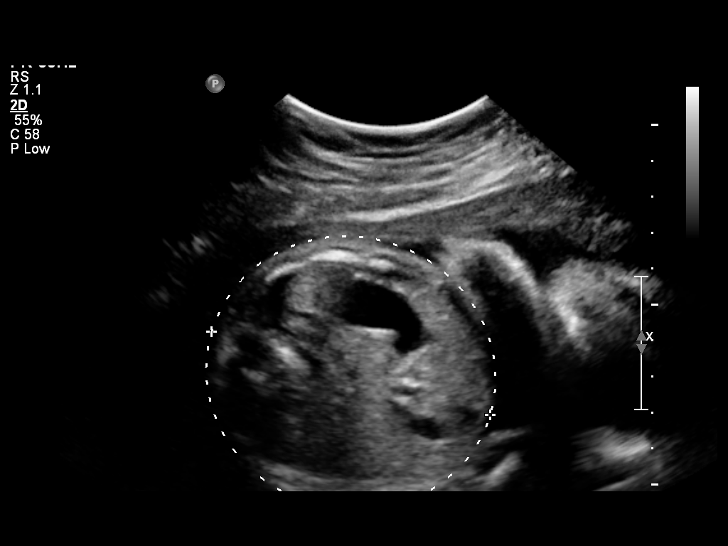
[im 35/52]
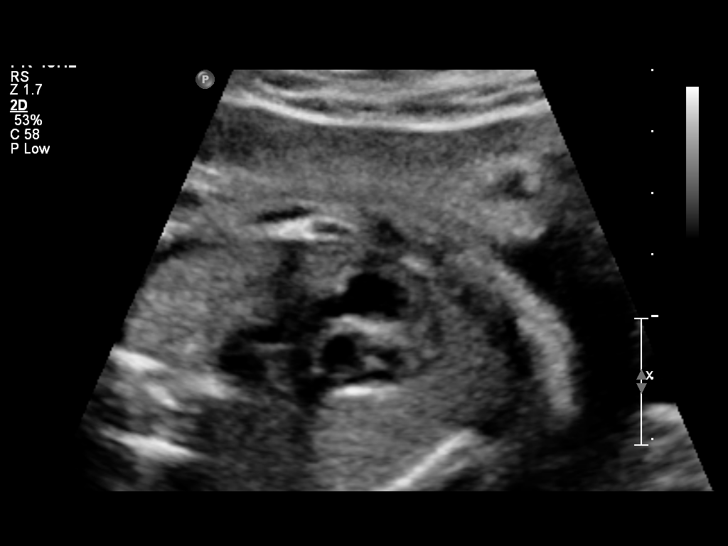
[im 43/52]
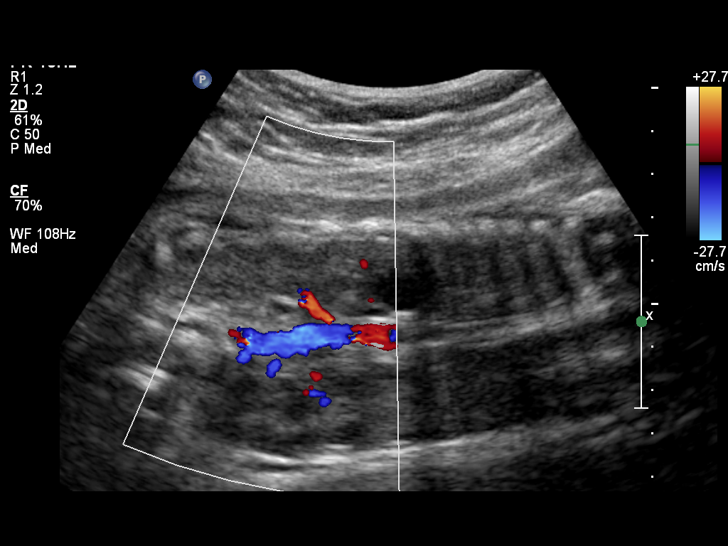
[im 49/52]
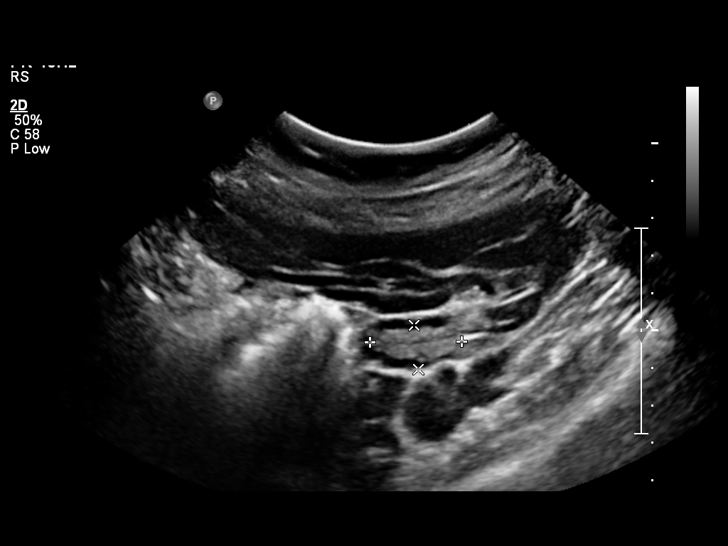

[Series 1: us ob follow up · 2 of 12 slices shown (2 of 3)]
[im 1/12]
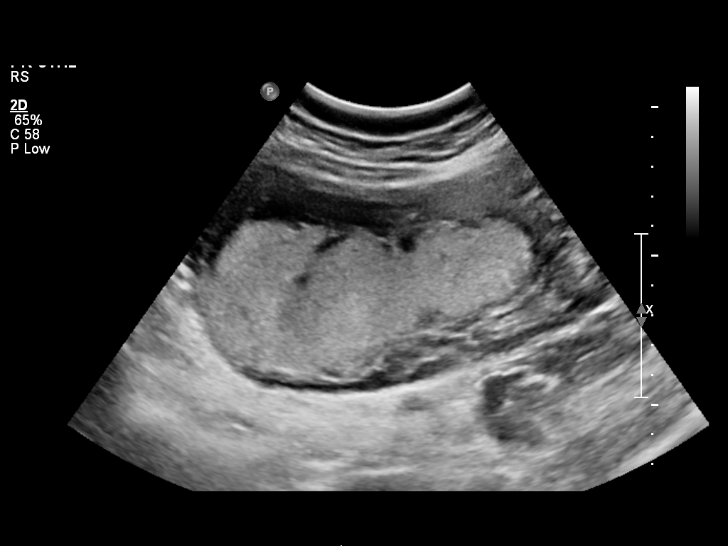
[im 9/12]
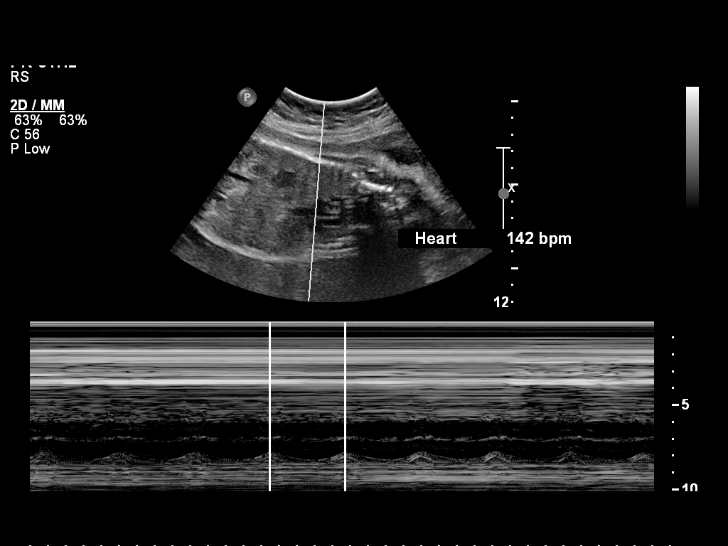

[Series 1: us ob follow up · 10 acquisitions, 2 frames shown (3 of 3)]
[im 1/10]
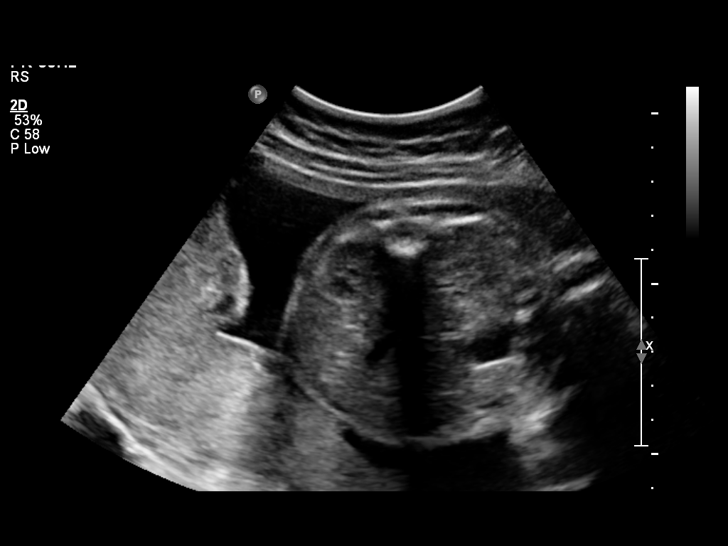
[im 7/10]
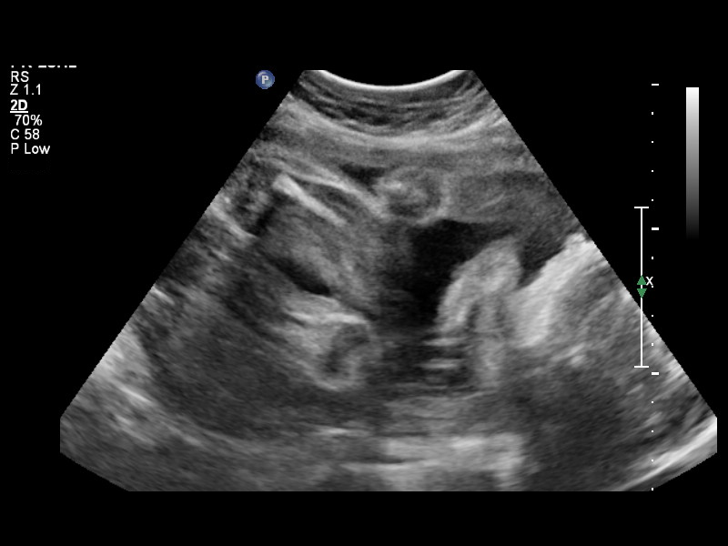

[12 of 28 positions shown; findings below may reference images not displayed]

OBSTETRICS REPORT
(Signed Final 12/24/2014 [DATE])

Service(s) Provided

US OB FOLLOW UP                                       76816.1
Indications

27 weeks gestation of pregnancy
Fetal abnormality -  (renal pyelectasis)
Fetal Evaluation

Num Of Fetuses:    1
Fetal Heart Rate:  130                          bpm
Cardiac Activity:  Observed
Presentation:      Cephalic
Placenta:          Posterior, above cervical
os
P. Cord            Previously Visualized
Insertion:

Amniotic Fluid
AFI FV:      Subjectively within normal limits
AFI Sum:     12.96   cm       36  %Tile     Larg Pckt:    3.69  cm
RUQ:   3.68    cm   RLQ:    2.6    cm    LUQ:   2.99    cm   LLQ:    3.69   cm
Biometry

BPD:     72.5  mm     G. Age:  29w 1d                CI:           75   70 - 86
FL/HC:      20.3   18.8 -
20.6
HC:     265.6  mm     G. Age:  29w 0d       59  %    HC/AC:      1.06   1.05 -
1.21
AC:     249.6  mm     G. Age:  29w 1d       83  %    FL/BPD:     74.2   71 - 87
FL:      53.8  mm     G. Age:  28w 4d       59  %    FL/AC:      21.6   20 - 24
HUM:     48.2  mm     G. Age:  28w 2d       59  %

Est. FW:    8622  gm    2 lb 14 oz      76  %
Gestational Age

LMP:           38w 6d        Date:  03/27/14                 EDD:   01/01/15
U/S Today:     29w 0d                                        EDD:   03/11/15
Best:          27w 5d     Det. By:  Early Ultrasound         EDD:   03/20/15
(07/28/14)
Anatomy
Cranium:          Appears normal         Aortic Arch:      Previously seen
Fetal Cavum:      Appears normal         Ductal Arch:      Previously seen
Ventricles:       Appears normal         Diaphragm:        Appears normal
Choroid Plexus:   Previously seen        Stomach:          Appears normal, left
sided
Cerebellum:       Previously seen        Abdomen:          Appears normal
Posterior Fossa:  Previously seen        Abdominal Wall:   Previously seen
Nuchal Fold:      Not applicable (>20    Cord Vessels:     Appears normal (3
wks GA)                                  vessel cord)
Face:             Orbits and profile     Kidneys:          Appear normal
previously seen
Lips:             Previously seen        Bladder:          Appears normal
Heart:            Appears normal         Spine:            Previously seen
(4CH, axis, and
situs)
RVOT:             Appears normal         Lower             Previously seen
Extremities:
LVOT:             Appears normal         Upper             Previously seen
Extremities:

Other:  Fetus appears to be a female. Heels and  Right 5th digit visualized.
Nasal bone prev visualized.
Cervix Uterus Adnexa

Left Ovary:    Size(cm) L: 2.28 x W: 1.94 x H: 2  Volume(cc):
Right Ovary:   Size(cm) L: 2.44 x W: 2.41 x H: 1.18  Volume(cc):
Impression

SIUP at 27+5 weeks
Normal interval anatomy; anatomic survey complete; renal
pelves within normal range today
Normal amniotic fluid volume
Appropriate interval growth with EFW at the 76th %tile
Recommendations

Follow-up as clinically indicated

Thank you for sharing in the care of Ms. CIGI BRABON with
questions or concerns.

## 2017-08-10 ENCOUNTER — Encounter: Payer: Self-pay | Admitting: *Deleted
